# Patient Record
Sex: Male | Born: 1945 | Race: White | Hispanic: No | Marital: Married | Smoking: Never smoker
Health system: Southern US, Community
[De-identification: ages and names within clinical notes are randomized; demographics above are authoritative.]

## PROBLEM LIST (undated history)

## (undated) DIAGNOSIS — E119 Type 2 diabetes mellitus without complications: Secondary | ICD-10-CM

## (undated) DIAGNOSIS — N289 Disorder of kidney and ureter, unspecified: Secondary | ICD-10-CM

## (undated) HISTORY — PX: OTHER SURGICAL HISTORY: SHX169

---

## 2017-11-28 ENCOUNTER — Emergency Department: Payer: BLUE CROSS/BLUE SHIELD

## 2017-11-28 ENCOUNTER — Encounter: Payer: Self-pay | Admitting: Emergency Medicine

## 2017-11-28 ENCOUNTER — Inpatient Hospital Stay
Admission: EM | Admit: 2017-11-28 | Discharge: 2017-12-03 | DRG: 682 | Disposition: A | Discharge status: Home / Self Care | Payer: BLUE CROSS/BLUE SHIELD | Attending: Internal Medicine | Admitting: Internal Medicine

## 2017-11-28 ENCOUNTER — Other Ambulatory Visit: Payer: Self-pay

## 2017-11-28 DIAGNOSIS — N179 Acute kidney failure, unspecified: Secondary | ICD-10-CM | POA: Diagnosis present

## 2017-11-28 DIAGNOSIS — K439 Ventral hernia without obstruction or gangrene: Secondary | ICD-10-CM | POA: Diagnosis present

## 2017-11-28 DIAGNOSIS — E876 Hypokalemia: Secondary | ICD-10-CM | POA: Diagnosis present

## 2017-11-28 DIAGNOSIS — G9341 Metabolic encephalopathy: Secondary | ICD-10-CM | POA: Diagnosis present

## 2017-11-28 DIAGNOSIS — N1330 Unspecified hydronephrosis: Secondary | ICD-10-CM

## 2017-11-28 DIAGNOSIS — I951 Orthostatic hypotension: Secondary | ICD-10-CM | POA: Diagnosis present

## 2017-11-28 DIAGNOSIS — I351 Nonrheumatic aortic (valve) insufficiency: Secondary | ICD-10-CM | POA: Diagnosis not present

## 2017-11-28 DIAGNOSIS — Z7951 Long term (current) use of inhaled steroids: Secondary | ICD-10-CM

## 2017-11-28 DIAGNOSIS — D631 Anemia in chronic kidney disease: Secondary | ICD-10-CM | POA: Diagnosis present

## 2017-11-28 DIAGNOSIS — E872 Acidosis: Secondary | ICD-10-CM | POA: Diagnosis present

## 2017-11-28 DIAGNOSIS — N185 Chronic kidney disease, stage 5: Secondary | ICD-10-CM | POA: Diagnosis present

## 2017-11-28 DIAGNOSIS — N132 Hydronephrosis with renal and ureteral calculous obstruction: Secondary | ICD-10-CM | POA: Diagnosis present

## 2017-11-28 DIAGNOSIS — Z8619 Personal history of other infectious and parasitic diseases: Secondary | ICD-10-CM

## 2017-11-28 DIAGNOSIS — R55 Syncope and collapse: Secondary | ICD-10-CM | POA: Diagnosis present

## 2017-11-28 DIAGNOSIS — F0390 Unspecified dementia without behavioral disturbance: Secondary | ICD-10-CM | POA: Diagnosis present

## 2017-11-28 DIAGNOSIS — I120 Hypertensive chronic kidney disease with stage 5 chronic kidney disease or end stage renal disease: Secondary | ICD-10-CM | POA: Diagnosis present

## 2017-11-28 DIAGNOSIS — Z936 Other artificial openings of urinary tract status: Secondary | ICD-10-CM | POA: Diagnosis not present

## 2017-11-28 DIAGNOSIS — E860 Dehydration: Secondary | ICD-10-CM | POA: Diagnosis present

## 2017-11-28 DIAGNOSIS — E1122 Type 2 diabetes mellitus with diabetic chronic kidney disease: Secondary | ICD-10-CM | POA: Diagnosis present

## 2017-11-28 DIAGNOSIS — Z87442 Personal history of urinary calculi: Secondary | ICD-10-CM | POA: Diagnosis not present

## 2017-11-28 DIAGNOSIS — N139 Obstructive and reflux uropathy, unspecified: Secondary | ICD-10-CM

## 2017-11-28 DIAGNOSIS — N2581 Secondary hyperparathyroidism of renal origin: Secondary | ICD-10-CM | POA: Diagnosis present

## 2017-11-28 DIAGNOSIS — R7989 Other specified abnormal findings of blood chemistry: Secondary | ICD-10-CM | POA: Diagnosis present

## 2017-11-28 DIAGNOSIS — F03A0 Unspecified dementia, mild, without behavioral disturbance, psychotic disturbance, mood disturbance, and anxiety: Secondary | ICD-10-CM

## 2017-11-28 HISTORY — DX: Disorder of kidney and ureter, unspecified: N28.9

## 2017-11-28 HISTORY — DX: Type 2 diabetes mellitus without complications: E11.9

## 2017-11-28 LAB — MAGNESIUM: MAGNESIUM: 2.4 mg/dL (ref 1.7–2.4)

## 2017-11-28 LAB — COMPREHENSIVE METABOLIC PANEL
ALBUMIN: 3.4 g/dL — AB (ref 3.5–5.0)
ALT: 20 U/L (ref 0–44)
ANION GAP: 20 — AB (ref 5–15)
AST: 16 U/L (ref 15–41)
Alkaline Phosphatase: 62 U/L (ref 38–126)
BUN: 154 mg/dL — AB (ref 8–23)
CHLORIDE: 96 mmol/L — AB (ref 98–111)
CO2: 17 mmol/L — ABNORMAL LOW (ref 22–32)
Calcium: 7.3 mg/dL — ABNORMAL LOW (ref 8.9–10.3)
Creatinine, Ser: 10.44 mg/dL — ABNORMAL HIGH (ref 0.61–1.24)
GFR calc Af Amer: 5 mL/min — ABNORMAL LOW (ref >60)
GFR, EST NON AFRICAN AMERICAN: 4 mL/min — AB (ref >60)
GLUCOSE: 153 mg/dL — AB (ref 70–99)
POTASSIUM: 4 mmol/L (ref 3.5–5.1)
Sodium: 133 mmol/L — ABNORMAL LOW (ref 135–145)
Total Bilirubin: 0.4 mg/dL (ref 0.3–1.2)
Total Protein: 7.2 g/dL (ref 6.5–8.1)

## 2017-11-28 LAB — CBC WITH DIFFERENTIAL/PLATELET
BASOS ABS: 0 10*3/uL (ref 0–0.1)
Basophils Relative: 0 %
Eosinophils Absolute: 0 10*3/uL (ref 0–0.7)
Eosinophils Relative: 0 %
HCT: 23.2 % — ABNORMAL LOW (ref 40.0–52.0)
Hemoglobin: 7.6 g/dL — ABNORMAL LOW (ref 13.0–18.0)
LYMPHS PCT: 11 %
Lymphs Abs: 1.4 10*3/uL (ref 1.0–3.6)
MCH: 26.2 pg (ref 26.0–34.0)
MCHC: 33 g/dL (ref 32.0–36.0)
MCV: 79.4 fL — ABNORMAL LOW (ref 80.0–100.0)
MONO ABS: 0.6 10*3/uL (ref 0.2–1.0)
Monocytes Relative: 5 %
NEUTROS PCT: 84 %
Neutro Abs: 10.5 10*3/uL — ABNORMAL HIGH (ref 1.4–6.5)
PLATELETS: 357 10*3/uL (ref 150–440)
RBC: 2.92 MIL/uL — AB (ref 4.40–5.90)
RDW: 17.9 % — AB (ref 11.5–14.5)
WBC: 12.6 10*3/uL — ABNORMAL HIGH (ref 3.8–10.6)

## 2017-11-28 LAB — ETHANOL

## 2017-11-28 LAB — BRAIN NATRIURETIC PEPTIDE: B Natriuretic Peptide: 63 pg/mL (ref 0.0–100.0)

## 2017-11-28 LAB — TROPONIN I
TROPONIN I: 0.05 ng/mL — AB (ref <0.03)
Troponin I: 0.05 ng/mL (ref <0.03)

## 2017-11-28 MED ORDER — SODIUM CHLORIDE 0.9 % IV SOLN
INTRAVENOUS | Status: DC
  Administered 2017-11-28 – 2017-12-03 (×7): via INTRAVENOUS

## 2017-11-28 MED ORDER — SENNOSIDES-DOCUSATE SODIUM 8.6-50 MG PO TABS: 1.0000 | ORAL_TABLET | Freq: Every evening | ORAL | Status: DC | PRN

## 2017-11-28 MED ORDER — ACETAMINOPHEN 650 MG RE SUPP: 650.0000 mg | Freq: Four times a day (QID) | RECTAL | Status: DC | PRN

## 2017-11-28 MED ORDER — ONDANSETRON HCL 4 MG/2ML IJ SOLN
4.0000 mg | Freq: Four times a day (QID) | INTRAMUSCULAR | Status: DC | PRN
  Filled 2017-11-28: qty 2

## 2017-11-28 MED ORDER — ACETAMINOPHEN 325 MG PO TABS: 650.0000 mg | ORAL_TABLET | Freq: Four times a day (QID) | ORAL | Status: DC | PRN

## 2017-11-28 MED ORDER — ONDANSETRON HCL 4 MG PO TABS: 4.0000 mg | ORAL_TABLET | Freq: Four times a day (QID) | ORAL | Status: DC | PRN

## 2017-11-28 MED ORDER — SODIUM CHLORIDE 0.9 % IV BOLUS
500.0000 mL | Freq: Once | INTRAVENOUS | Status: AC
  Administered 2017-11-28: 500 mL via INTRAVENOUS

## 2017-11-28 MED ORDER — HEPARIN SODIUM (PORCINE) 5000 UNIT/ML IJ SOLN
5000.0000 [IU] | Freq: Three times a day (TID) | INTRAMUSCULAR | Status: DC
  Administered 2017-11-28 – 2017-11-30 (×4): 5000 [IU] via SUBCUTANEOUS
  Filled 2017-11-28 (×5): qty 1

## 2017-11-28 NOTE — Progress Notes (Signed)
Advanced care plan. Purpose of the Encounter: CODE STATUS Parties in Attendance: Patient Patient's Decision Capacity: Good Subjective/Patient's story: Presented to the emergency room for episodes of syncope Objective/Medical story Patient had couple of episodes of syncope in the last few weeks he also was in the gas station And was dehydrated and felt hot and passed out briefly but completely awake in the emergency room and verbally responsive. Goals of care determination:  Advance care directives and goals of care discussed with the patient in detail He will be evaluated for his kidney disease and work-up for syncope For now he wants everything done which includes cardiac resuscitation, intubation and ventilator if the need arises CODE STATUS: Full code Time spent discussing advanced care planning: 16 minutes

## 2017-11-28 NOTE — ED Notes (Signed)
Per pt he was driving from [place] to the jail to pick up a male inmate. When asked why he said he wasn't sure how to explain it. Also states he was driving down here to make an appt with [facility] for his kidney disease. We are unable to reach his wife - left a msg at the number given. His cell phone is dead and wont hold a charge when we tried to charge it. Has a [employer] hotel card in his shirt pocket. States he stayed there last night. Story is a little confusing. Officers to verify if pt is a code silver from [place].

## 2017-11-28 NOTE — ED Triage Notes (Signed)
Pt was driving from [place]. Stopped at gas station - ems was called for a fall. Pt was sitting outside (per pt for 10 mins) pt reported dizziness. When pt stood for ems became pale and diaphoretic and per ems was unconscious but was still able to sit back down once he was sitting again he was a & ox4

## 2017-11-28 NOTE — H&P (Addendum)
[facility] [place] [facility]   PATIENT NAME: Manuel Walker    MR#:  [id number]  DATE OF BIRTH:  1945-09-26  DATE OF ADMISSION:  11/28/2017  PRIMARY CARE PHYSICIAN: System, Pcp Not In   REQUESTING/REFERRING PHYSICIAN:   CHIEF COMPLAINT:   Chief Complaint  Patient presents with  . Loss of Consciousness    HISTORY OF PRESENT ILLNESS: Manuel Walker  is a 72 y.o. male with a known history of chronic kidney disease stage V, diabetes mellitus type 2 was driving down from [place] and he was wandering around in his car and came to [facility].  He was at the [employer] station patient felt very hot and passed out briefly.  Patient had nothing to drink since this morning after he left the home at 5 AM.  He has been driving in the hot sun.  He just had a bowel of cereal today since he left the home.  EMS was called when patient was found on the ground in the gas station no evidence of any trauma to the head patient is completely awake and verbally responsive in the emergency room.. No complaints of any chest pain, shortness of breath.  Patient has a nephrostomy tube and ventral abdominal hernia .  He was worked up with CT head in the emergency room.  Hospitalist service was consulted for further care.  Emergency room physician had an opportunity talk to patient's wife in [place] who said patient had multiple episodes of syncope in the last couple of weeks.  PAST MEDICAL HISTORY:   Past Medical History:  Diagnosis Date  . Diabetes mellitus without complication (HCC)   . Renal disorder     PAST SURGICAL HISTORY:  Past Surgical History:  Procedure Laterality Date  . nephostomy      SOCIAL HISTORY:  Social History   Tobacco Use  . Smoking status: Never Smoker  . Smokeless tobacco: Never Used  Substance Use Topics  . Alcohol use: Not Currently    FAMILY HISTORY: History reviewed. No pertinent family history.  DRUG ALLERGIES:   Allergies  Allergen Reactions  . Penicillins     REVIEW OF SYSTEMS:   CONSTITUTIONAL: No fever, has fatigue and weakness.  EYES: No blurred or double vision.  EARS, NOSE, AND THROAT: No tinnitus or ear pain.  RESPIRATORY: No cough, shortness of breath, wheezing or hemoptysis.  CARDIOVASCULAR: No chest pain, orthopnea, edema.  GASTROINTESTINAL: No nausea, vomiting, diarrhea or abdominal pain.  GENITOURINARY: No dysuria, hematuria.  ENDOCRINE: No polyuria, nocturia,  HEMATOLOGY: No anemia, easy bruising or bleeding SKIN: No rash or lesion. MUSCULOSKELETAL: No joint pain or arthritis.   NEUROLOGIC: No tingling, numbness, weakness.  PSYCHIATRY: No anxiety or depression.   MEDICATIONS AT HOME:  Prior to Admission medications   Not on File      PHYSICAL EXAMINATION:   VITAL SIGNS: Blood pressure 110/70, pulse 88, temperature 98.5 F (36.9 C), resp. rate 12, height 6' 0.5" (1.842 m), weight 73.5 kg (162 lb), SpO2 98 %.  GENERAL:  72 y.o.-year-old patient lying in the bed with no acute distress.  EYES: Pupils equal, round, reactive to light and accommodation. No scleral icterus. Extraocular muscles intact.  HEENT: Head atraumatic, normocephalic. Oropharynx dry and nasopharynx clear.  NECK:  Supple, no jugular venous distention. No thyroid enlargement, no tenderness.  LUNGS: Normal breath sounds bilaterally, no wheezing, rales,rhonchi or crepitation. No use of accessory muscles of respiration.  CARDIOVASCULAR: S1, S2 normal. No  murmurs, rubs, or gallops.  ABDOMEN: Soft, nontender, nondistended. Bowel sounds present.  Has ventral hernia EXTREMITIES: No pedal edema, cyanosis, or clubbing.  NEUROLOGIC: Cranial nerves II through XII are intact. Muscle strength 5/5 in all extremities. Sensation intact. Gait not checked.  PSYCHIATRIC: The patient is alert and oriented x 3.  SKIN: No obvious rash, lesion, or ulcer.   LABORATORY PANEL:   CBC Recent Labs  Lab 11/28/17 1707  WBC  12.6*  HGB 7.6*  HCT 23.2*  PLT 357  MCV 79.4*  MCH 26.2  MCHC 33.0  RDW 17.9*  LYMPHSABS 1.4  MONOABS 0.6  EOSABS 0.0  BASOSABS 0.0   ------------------------------------------------------------------------------------------------------------------  Chemistries  Recent Labs  Lab 11/28/17 1707  NA 133*  K 4.0  CL 96*  CO2 17*  GLUCOSE 153*  BUN 154*  CREATININE 10.44*  CALCIUM 7.3*  MG 2.4  AST 16  ALT 20  ALKPHOS 62  BILITOT 0.4   ------------------------------------------------------------------------------------------------------------------ estimated creatinine clearance is 6.6 mL/min (A) (by C-G formula based on SCr of 10.44 mg/dL (H)). ------------------------------------------------------------------------------------------------------------------ No results for input(s): TSH, T4TOTAL, T3FREE, THYROIDAB in the last 72 hours.  Invalid input(s): FREET3   Coagulation profile No results for input(s): INR, PROTIME in the last 168 hours. ------------------------------------------------------------------------------------------------------------------- No results for input(s): DDIMER in the last 72 hours. -------------------------------------------------------------------------------------------------------------------  Cardiac Enzymes Recent Labs  Lab 11/28/17 1707  TROPONINI 0.05*   ------------------------------------------------------------------------------------------------------------------ Invalid input(s): POCBNP  ---------------------------------------------------------------------------------------------------------------  Urinalysis No results found for: COLORURINE, APPEARANCEUR, LABSPEC, PHURINE, GLUCOSEU, HGBUR, BILIRUBINUR, KETONESUR, PROTEINUR, UROBILINOGEN, NITRITE, LEUKOCYTESUR   RADIOLOGY: Ct Head Wo Contrast  Result Date: 11/28/2017 CLINICAL DATA:  Syncope. EXAM: CT HEAD WITHOUT CONTRAST TECHNIQUE: Contiguous axial images were  obtained from the base of the skull through the vertex without intravenous contrast. COMPARISON:  None. FINDINGS: Brain: No evidence of acute infarction, hemorrhage, hydrocephalus, extra-axial collection or mass lesion/mass effect. Mild generalized cerebral atrophy. Scattered mild periventricular and subcortical white matter hypodensities are nonspecific, but favored to reflect chronic microvascular ischemic changes. 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 Calcified atherosclerosis at the skullbase. No hyperdense vessel. Skull: Normal. Negative for fracture or focal lesion. Sinuses/Orbits: No acute finding. Other: Left frontal scalp lipoma. IMPRESSION: 1.  No acute intracranial abnormality. 2. 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 Non-emergent CTA or MRA of the head is recommended for further evaluation. Electronically Signed   By: Obie Dredge M.D.   On: 11/28/2017 18:24    EKG: Orders placed or performed during the hospital encounter of 11/28/17  . ED EKG  . ED EKG  . EKG 12-Lead  . EKG 12-Lead    IMPRESSION AND PLAN:  72 year old male patient with history of chronic kidney disease stage V with a nephrostomy tube not on dialysis, diabetes mellitus type 2 was found wandering in the car in [place].  He also had passed out in the gas station before being picked up by EMS.  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 Management plans discussed with the patient, family and they are in agreement.  CODE STATUS:Full  code    TOTAL TIME TAKING CARE OF THIS  PATIENT: 55 minutes.    Ihor AustinPavan Arihana Ambrocio M.D on 11/28/2017 at 6:27 PM  Between 7am to 6pm - Pager - [phone]  After 6pm go to [email or web address] - password [email or web address]  [facility]  Office  [phone]  CC: Primary care physician; System, Pcp Not In

## 2017-11-28 NOTE — ED Notes (Signed)
Pt has nephrostomy in left kidney. States also urinates. Pt given urinal. Has nephrostomy d/t kidney stone

## 2017-11-28 NOTE — Progress Notes (Signed)
Pt arrived from ED alert and oriented. No c/o pain, no SOB. Pt oriented to equipment and staff. No concerns offered at this time.

## 2017-11-28 NOTE — ED Provider Notes (Addendum)
[facility] Emergency Department Provider Note  ____________________________________________   I have reviewed the triage vital signs and the nursing notes. Where available I have reviewed prior notes and, if possible and indicated, outside hospital notes.    HISTORY  Chief Complaint Loss of Consciousness    HPI Manuel Walker is a [age] y.o. male \\who  is a somewhat poor historian.  Patient resents today after 2 syncopal events.  Patient is driving down from somewhere in [place] to pick someone up he states.  He was at a gas station, he felt hot and he passed out briefly.  EMS was called they found him on the ground, no evidence of trauma, he was able to walk but as he was walking he passed out again they caught him.  Patient denies head injury.  He states that he does not know why he passed out.  Did not have any chest pain or shortness of breath.  He states he had nothing to drink but a little bit of water today and it is very hot out.  He is in an air conditioned car.  He states the only thing he had to eat today was a bowl of cereal which is atypical for him.  He states he was focused on driving.  He denies any chest pain shortness of breath nausea or vomiting.  When asking if he has any health issues he states that he does not.  He does take metoprolol.  He has a nephrostomy tube draining on the left side which she states is from kidney stones.  Level 5 chart caveat; no further history available due to patient status.      Past Medical History:  Diagnosis Date  . Diabetes mellitus without complication (HCC)   . Renal disorder     There are no active problems to display for this patient.   Past Surgical History:  Procedure Laterality Date  . nephostomy      Prior to Admission medications   Not on File    Allergies Penicillins  History reviewed. No pertinent family history.  Social History Social History   Tobacco Use  . Smoking status: Never  Smoker  . Smokeless tobacco: Never Used  Substance Use Topics  . Alcohol use: Not Currently  . Drug use: Not Currently    Review of Systems Constitutional: No fever/chills Eyes: No visual changes. ENT: No sore throat. No stiff neck no neck pain Cardiovascular: Denies chest pain. Respiratory: Denies shortness of breath. Gastrointestinal:   no vomiting.  No diarrhea.  No constipation. Genitourinary: Negative for dysuria. Musculoskeletal: Negative lower extremity swelling Skin: Negative for rash. Neurological: Negative for severe headaches, focal weakness or numbness.   ____________________________________________   PHYSICAL EXAM:  VITAL SIGNS: ED Triage Vitals  Enc Vitals Group     BP 11/28/17 1658 110/70     Pulse Rate 11/28/17 1658 88     Resp 11/28/17 1658 12     Temp 11/28/17 1658 98.5 F (36.9 C)     Temp src --      SpO2 11/28/17 1658 98 %     Weight 11/28/17 1659 162 lb (73.5 kg)     Height 11/28/17 1659 6' 0.5" (1.842 m)     Head Circumference --      Peak Flow --      Pain Score 11/28/17 1659 0     Pain Loc --      Pain Edu? --      Excl. in  GC? --     Constitutional: Alert and oriented to name and place, unsure of the date.. Well appearing and in no acute distress. Eyes: Conjunctivae are normal Head: Atraumatic HEENT: No congestion/rhinnorhea. Mucous membranes are moist.  Oropharynx non-erythematous Neck:   Nontender with no meningismus, no masses, no stridor Cardiovascular: Normal rate, regular rhythm. Grossly normal heart sounds.  Good peripheral circulation. Respiratory: Normal respiratory effort.  No retractions. Lungs CTAB. Abdominal: Soft and nontender. No distention. No guarding no rebound Back:  There is no focal tenderness or step off.  there is no midline tenderness there are no lesions noted. there is no CVA tenderness, urostomy tube in place Musculoskeletal: No lower extremity tenderness, no upper extremity tenderness. No joint effusions, no  DVT signs strong distal pulses no edema Neurologic:  Normal speech and language. No gross focal neurologic deficits are appreciated.  Skin:  Skin is warm, dry and intact. No rash noted. Psychiatric: Mood and affect are normal. Speech and behavior are normal.  ____________________________________________   LABS (all labs ordered are listed, but only abnormal results are displayed)  Labs Reviewed  CBC WITH DIFFERENTIAL/PLATELET - Abnormal; Notable for the following components:      Result Value   WBC 12.6 (*)    RBC 2.92 (*)    Hemoglobin 7.6 (*)    HCT 23.2 (*)    MCV 79.4 (*)    RDW 17.9 (*)    Neutro Abs 10.5 (*)    All other components within normal limits  MAGNESIUM  COMPREHENSIVE METABOLIC PANEL  ETHANOL  BRAIN NATRIURETIC PEPTIDE  TROPONIN I  URINALYSIS, COMPLETE (UACMP) WITH MICROSCOPIC    Pertinent labs  results that were available during my care of the patient were reviewed by me and considered in my medical decision making (see chart for details). ____________________________________________  EKG  I personally interpreted any EKGs ordered by me or triage Sinus rhythm rate 87 bpm, normal axis, PACs noted.  No acute ischemic changes.  Nonspecific ST changes right bundle branch block no old for comparison ____________________________________________  RADIOLOGY  Pertinent labs & imaging results that were available during my care of the patient were reviewed by me and considered in my medical decision making (see chart for details). If possible, patient and/or family made aware of any abnormal findings.  No results found. ____________________________________________    PROCEDURES  Procedure(s) performed: None  Procedures  Critical Care performed: CRITICAL CARE Performed by: Jeanmarie Plant   Total critical care time: 55 minutes  Critical care time was exclusive of separately billable procedures and treating other patients.  Critical care was necessary  to treat or prevent imminent or life-threatening deterioration.  Critical care was time spent personally by me on the following activities: development of treatment plan with patient and/or surrogate as well as nursing, discussions with consultants, evaluation of patient's response to treatment, examination of patient, obtaining history from patient or surrogate, ordering and performing treatments and interventions, ordering and review of laboratory studies, ordering and review of radiographic studies, pulse oximetry and re-evaluation of patient's condition.   ____________________________________________   INITIAL IMPRESSION / ASSESSMENT AND PLAN / ED COURSE  Pertinent labs & imaging results that were available during my care of the patient were reviewed by me and considered in my medical decision making (see chart for details).  Patient here after 2 syncopal events today.  Is quite hot with a very high he has exceeded not much to eat or drink and that could be why I  am concerned because he has no way to get back to [place] without driving and he did pass out twice once was witnessed by EMS with no seizure activity.  Blood sugar was normal for EMS prior to coming in.  Patient's work-up is reassuring although he is unsure of the date and we have no way of knowing if this is his baseline.  I did try to call his wife and left a message which is Cipro compliant but have not yet heard back.  Given his confusion, which I do not know if this is baseline or not at age 32, we will obtain CT scan of the head, and I am initiating broad work-up to make sure there is no other acute pathology.  Given 2 episodes of syncope which certainly could be cardiac mediated, we will also admit to the hospital for observation.  ----------------------------------------- 6:00 PM on 11/28/2017 -----------------------------------------  I was able finally to talk to his wife, she states that he is passed out numerous times  she is not sure why is driving in [place] today, patient said something vague about picking someone up.  She states that he is confused at baseline these days.  She states he is in stage V kidney failure, and that his creatinine has been as high as 11 although usually is around 8, however he is been refusing dialysis.  He is known to be significantly anemic at baseline although she does not know those numbers.  She gets his care from [facility].  Patient was in the ER couple weeks ago apparently after nearly passing out or passing out and was told by the ER doctor that he would die without dialysis and this is something he is apparently aware of.  None of this he did tell me.  In any event, patient seems to be close to his baseline but his baseline is quite ill, certainly do not feel it safe for him at this point to be driving down the road.  His wife's phone number is [phone], and I have advised her to stay by the phone for the hospital doctors phone call.  Patient apparently is very significantly ill at baseline and has refused some care.  He has not had his blood drawn in 2 months so we do not know exactly where he stands in terms of his blood work according to his wife is unclear if that was done in the emergency room we will try to see if we can find more records now that we know what hospital he goes to  ----------------------------------------- 6:33 PM on 11/28/2017 -----------------------------------------  BUN/creatinine are pretty unremarkable today.  Baseline seems to be around 8 according to his wife although last time it was checked it was in the 6 range, today is 10 he is been this high before BUNs 154.  Hospitalist is taken over care.    ____________________________________________   FINAL CLINICAL IMPRESSION(S) / ED DIAGNOSES  Final diagnoses:  None      This chart was dictated using voice recognition software.  Despite best efforts to proofread,  errors can occur  which can change meaning.      Jeanmarie Plant, MD 11/28/17 1752    Jeanmarie Plant, MD 11/28/17 1805    Jeanmarie Plant, MD 11/28/17 [phone]

## 2017-11-28 NOTE — ED Notes (Signed)
Report called  

## 2017-11-29 ENCOUNTER — Inpatient Hospital Stay (HOSPITAL_COMMUNITY)
Admit: 2017-11-29 | Discharge: 2017-11-29 | Disposition: A | Discharge status: Home / Self Care | Payer: BLUE CROSS/BLUE SHIELD | Attending: Internal Medicine | Admitting: Internal Medicine

## 2017-11-29 ENCOUNTER — Inpatient Hospital Stay: Payer: BLUE CROSS/BLUE SHIELD

## 2017-11-29 ENCOUNTER — Other Ambulatory Visit: Payer: Self-pay

## 2017-11-29 DIAGNOSIS — R55 Syncope and collapse: Secondary | ICD-10-CM

## 2017-11-29 DIAGNOSIS — Z936 Other artificial openings of urinary tract status: Secondary | ICD-10-CM

## 2017-11-29 DIAGNOSIS — I351 Nonrheumatic aortic (valve) insufficiency: Secondary | ICD-10-CM

## 2017-11-29 DIAGNOSIS — N179 Acute kidney failure, unspecified: Secondary | ICD-10-CM

## 2017-11-29 LAB — BASIC METABOLIC PANEL
Anion gap: 16 — ABNORMAL HIGH (ref 5–15)
BUN: 158 mg/dL — ABNORMAL HIGH (ref 8–23)
CO2: 19 mmol/L — AB (ref 22–32)
Calcium: 6.7 mg/dL — ABNORMAL LOW (ref 8.9–10.3)
Chloride: 101 mmol/L (ref 98–111)
Creatinine, Ser: 10.33 mg/dL — ABNORMAL HIGH (ref 0.61–1.24)
GFR calc non Af Amer: 4 mL/min — ABNORMAL LOW (ref >60)
GFR, EST AFRICAN AMERICAN: 5 mL/min — AB (ref >60)
Glucose, Bld: 105 mg/dL — ABNORMAL HIGH (ref 70–99)
Potassium: 3.7 mmol/L (ref 3.5–5.1)
Sodium: 136 mmol/L (ref 135–145)

## 2017-11-29 LAB — GLUCOSE, CAPILLARY
Glucose-Capillary: 99 mg/dL (ref 70–99)
Glucose-Capillary: 108 mg/dL — ABNORMAL HIGH (ref 70–99)

## 2017-11-29 LAB — ECHOCARDIOGRAM COMPLETE
HEIGHTINCHES: 72 in
Weight: 3051.2 oz

## 2017-11-29 LAB — TROPONIN I: Troponin I: 0.05 ng/mL (ref <0.03)

## 2017-11-29 LAB — PHOSPHORUS: Phosphorus: 9.9 mg/dL — ABNORMAL HIGH (ref 2.5–4.6)

## 2017-11-29 MED ORDER — INSULIN ASPART 100 UNIT/ML ~~LOC~~ SOLN: 0.0000 [IU] | Freq: Every day | SUBCUTANEOUS | Status: DC

## 2017-11-29 MED ORDER — CALCIUM ACETATE (PHOS BINDER) 667 MG PO CAPS
667.0000 mg | ORAL_CAPSULE | Freq: Three times a day (TID) | ORAL | Status: DC
  Administered 2017-11-29 – 2017-12-01 (×3): 667 mg via ORAL
  Filled 2017-11-29 (×8): qty 1

## 2017-11-29 MED ORDER — FLUTICASONE PROPIONATE 50 MCG/ACT NA SUSP
1.0000 | Freq: Every day | NASAL | Status: DC
  Administered 2017-11-29 – 2017-12-03 (×5): 1 via NASAL
  Filled 2017-11-29: qty 16

## 2017-11-29 MED ORDER — SODIUM BICARBONATE 650 MG PO TABS
650.0000 mg | ORAL_TABLET | Freq: Four times a day (QID) | ORAL | Status: DC
  Administered 2017-11-29 – 2017-12-03 (×15): 650 mg via ORAL
  Filled 2017-11-29 (×15): qty 1

## 2017-11-29 MED ORDER — INSULIN ASPART 100 UNIT/ML ~~LOC~~ SOLN
0.0000 [IU] | Freq: Three times a day (TID) | SUBCUTANEOUS | Status: DC
  Administered 2017-11-30: 1 [IU] via SUBCUTANEOUS
  Filled 2017-11-29: qty 1

## 2017-11-29 MED ORDER — VITAMIN D3 25 MCG (1000 UNIT) PO TABS
1000.0000 [IU] | ORAL_TABLET | Freq: Every day | ORAL | Status: DC
  Administered 2017-11-29 – 2017-12-03 (×4): 1000 [IU] via ORAL
  Filled 2017-11-29 (×5): qty 1

## 2017-11-29 NOTE — Progress Notes (Signed)
Patient was sitting in the chair.  I found his nephrostomy tube and bag on the floor.When he looked at it he didntt know what it was.  When I asked if he knew he had a nephrostomy tube it said he did.  I asked how it came out and he didn't know.  Dr. Imogene Burnhen and Dr. Wynelle LinkKolluru notified.

## 2017-11-29 NOTE — Progress Notes (Addendum)
[employer] -  at [facility]   PATIENT NAME: Manuel SprayRobert Walker    MR#:  [id number]  DATE OF BIRTH:  05/31/1946  SUBJECTIVE:  CHIEF COMPLAINT:   Chief Complaint  Patient presents with  . Loss of Consciousness   The patient has no complaints. REVIEW OF SYSTEMS:  Review of Systems  Constitutional: Negative for chills, fever and malaise/fatigue.  HENT: Negative for sore throat.   Eyes: Negative for blurred vision and double vision.  Respiratory: Negative for cough, hemoptysis, shortness of breath, wheezing and stridor.   Cardiovascular: Negative for chest pain, palpitations, orthopnea and leg swelling.  Gastrointestinal: Negative for abdominal pain, blood in stool, diarrhea, melena, nausea and vomiting.  Genitourinary: Negative for dysuria, flank pain and hematuria.  Musculoskeletal: Negative for back pain and joint pain.  Skin: Negative for rash.  Neurological: Negative for dizziness, sensory change, focal weakness, seizures, loss of consciousness, weakness and headaches.  Endo/Heme/Allergies: Negative for polydipsia.  Psychiatric/Behavioral: Negative for depression. The patient is not nervous/anxious.     DRUG ALLERGIES:   Allergies  Allergen Reactions  . Penicillins Swelling    Has patient had a PCN reaction causing immediate rash, facial/tongue/throat swelling, SOB or lightheadedness with hypotension: Unknown Has patient had a PCN reaction causing severe rash involving mucus membranes or skin necrosis: Unknown Has patient had a PCN reaction that required hospitalization: Unknown Has patient had a PCN reaction occurring within the last 10 years: Unknown If all of the above answers are "NO", then may proceed with Cephalosporin use.   VITALS:  Blood pressure 117/74, pulse 89, temperature 98.1 F (36.7 C), temperature source Oral, resp. rate 16, height 6' (1.829 m), weight 190 lb 11.2 oz (86.5 kg), SpO2 100 %. PHYSICAL EXAMINATION:  Physical Exam    Constitutional: He is oriented to person, place, and time. He appears well-developed.  HENT:  Head: Normocephalic.  Mouth/Throat: Oropharynx is clear and moist.  Eyes: Pupils are equal, round, and reactive to light. Conjunctivae and EOM are normal. No scleral icterus.  Neck: Normal range of motion. Neck supple. No JVD present. No tracheal deviation present.  Cardiovascular: Normal rate, regular rhythm and normal heart sounds. Exam reveals no gallop.  No murmur heard. Pulmonary/Chest: Effort normal and breath sounds normal. No respiratory distress. He has no wheezes. He has no rales.  Abdominal: Soft. Bowel sounds are normal. He exhibits no distension. There is no tenderness. There is no rebound.  Musculoskeletal: Normal range of motion. He exhibits no edema or tenderness.  Neurological: He is alert and oriented to person, place, and time. No cranial nerve deficit.  Skin: No rash noted. No erythema.  Psychiatric: He has a normal mood and affect.   LABORATORY PANEL:  Male CBC Recent Labs  Lab 11/28/17 1707  WBC 12.6*  HGB 7.6*  HCT 23.2*  PLT 357   ------------------------------------------------------------------------------------------------------------------ Chemistries  Recent Labs  Lab 11/28/17 1707 11/29/17 0502  NA 133* 136  K 4.0 3.7  CL 96* 101  CO2 17* 19*  GLUCOSE 153* 105*  BUN 154* PENDING  CREATININE 10.44* 10.33*  CALCIUM 7.3* 6.7*  MG 2.4  --   AST 16  --   ALT 20  --   ALKPHOS 62  --   BILITOT 0.4  --    RADIOLOGY:  Ct Head Wo Contrast  Result Date: 11/28/2017 CLINICAL DATA:  Syncope. EXAM: CT HEAD WITHOUT CONTRAST TECHNIQUE: Contiguous axial images were obtained from the base of the skull through the vertex without intravenous  contrast. COMPARISON:  None. FINDINGS: Brain: No evidence of acute infarction, hemorrhage, hydrocephalus, extra-axial collection or mass lesion/mass effect. Mild generalized cerebral atrophy. Scattered mild periventricular and  subcortical white matter hypodensities are nonspecific, but favored to reflect chronic microvascular ischemic changes. 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 Calcified atherosclerosis at the skullbase. No hyperdense vessel. Skull: Normal. Negative for fracture or focal lesion. Sinuses/Orbits: No acute finding. Other: Left frontal scalp lipoma. IMPRESSION: 1.  No acute intracranial abnormality. 2. 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 Non-emergent CTA or MRA of the head is recommended for further evaluation. Electronically Signed   By: Obie Dredge M.D.   On: 11/28/2017 18:24   US Carotid Bilateral  Result Date: 11/29/2017 CLINICAL DATA:  Syncope EXAM: BILATERAL CAROTID DUPLEX ULTRASOUND TECHNIQUE: Wallace Cullens scale imaging, color Doppler and duplex ultrasound was performed of bilateral carotid and vertebral arteries in the neck. COMPARISON:  None. TECHNIQUE: Quantification of carotid stenosis is based on velocity parameters that correlate the residual internal carotid diameter with NASCET-based stenosis levels, using the diameter of the distal internal carotid lumen as the denominator for stenosis measurement. The following velocity measurements were obtained: PEAK SYSTOLIC/END DIASTOLIC RIGHT ICA:                     121/30cm/sec CCA:                     106/10cm/sec SYSTOLIC ICA/CCA RATIO:  1.1 ECA:                     101cm/sec LEFT ICA:                     91/32cm/sec CCA:                     86/13cm/sec SYSTOLIC ICA/CCA RATIO:  1.1 ECA:                     92cm/sec FINDINGS: RIGHT CAROTID ARTERY: No significant plaque or stenosis. Normal waveforms and color Doppler signal. RIGHT VERTEBRAL ARTERY:  Normal flow direction and waveform. LEFT CAROTID ARTERY: Mild tortuosity. No significant plaque or stenosis. Normal waveforms and color Doppler signal. LEFT VERTEBRAL ARTERY: Normal flow direction and waveform. IMPRESSION: 1. No significant  carotid plaque or stenosis. 2.  Antegrade bilateral vertebral arterial flow. Electronically Signed   By: Corlis Leak M.D.   On: 11/29/2017 12:04   Dg Chest Port 1 View  Result Date: 11/28/2017 CLINICAL DATA:  Fall. EXAM: PORTABLE CHEST 1 VIEW COMPARISON:  None. FINDINGS: Mild cardiomegaly. The hila and mediastinum are unremarkable. No pneumothorax. No nodules or masses. No focal infiltrates. No cause for pain identified. IMPRESSION: No active disease. Electronically Signed   By: Gerome Sam III M.D   On: 11/28/2017 18:30   ASSESSMENT AND PLAN:   72 year old male patient with history of chronic kidney disease stage V with a nephrostomy tube not on dialysis, diabetes mellitus type 2 was found wandering in the car in [place].  He also had passed out in the gas station before being picked up by EMS.  -Altered mental status Acute metabolic encephalopathy Improved.  -Syncope, possible due to worsening renal function and orthostatic hypotension. Echocardiogram is pending, carotid ultrasound: Unremarkable. IV fluids, hold Lopressor.  Hypertension.  Hold beta-blocker due to low side blood pressure.  Elevated troponin.  Possible due to worsening renal  function.  Stable.  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 Per Dr. Cassandria Anger, follows with [facility] in [place], [place]. No acute indication for dialysis at this time. Continue sodium bicarbonate.   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  -Abdominal hernia Supportive care  Discussed with Dr. Wynelle Link. All the records are reviewed and case discussed with Care Management/Social Worker. Management plans discussed with the patient, family and they are in agreement.  CODE STATUS: Full Code  TOTAL TIME TAKING CARE OF THIS PATIENT: 36 minutes.   More than 50% of the time was spent in counseling/coordination of care: YES  POSSIBLE  D/C IN 2 DAYS, DEPENDING ON CLINICAL CONDITION.   Shaune Pollack M.D on 11/29/2017 at 1:44 PM  Between 7am to 6pm - Pager - [phone]  After 6pm go to [email or web address] - Therapist, nutritional Hospitalists

## 2017-11-29 NOTE — Consult Note (Signed)
[facility]  CONSULT NOTE    Date: 11/29/2017                  Patient Name:  Manuel Walker  MRN: [id number]  DOB: 26-Jan-1946  Age / Sex: 72 y.o., male         PCP: System, Pcp Not In                 Service Requesting Consult: Dr. Tobi BastosPyreddy                 Reason for Consult: Chronic kidney disease stage V            History of Present Illness: Mr. Manuel SprayRobert Boettner is a 72 y.o. white male with chronic kidney disease stage V, obstructive uropathy status post left urostomy, hypertension, diabetes mellitus type II, history of Lyme disease who was admitted to [facility] on 11/28/2017 for Syncope and collapse [R55] Syncope [R55]  Patient states he follows with [facility] [place]. He denies any uremic symptoms including dysguesia, edema, shortness of breath, nausea, anorexia, fatigue or pruritis. He states he has been educated about dialysis modalities and has had vessel mapping. Currently without an access.   Patient states that he was overheated from the heat and that is what caused his syncopal episodes. Denies any recent syncopal episodes prior to this one.   Patient continues to make urine, has a left nephrostomy tube.    Medications: Outpatient medications: Medications Prior to Admission  Medication Sig Dispense Refill Last Dose  . B Complex Vitamins (VITAMIN B COMPLEX PO) Take 1 tablet by mouth daily.   11/28/2017 at Unknown time  . calcium acetate (PHOSLO) 667 MG capsule Take 1 capsule by mouth 3 (three) times daily.   11/28/2017 at Unknown time  . cholecalciferol (VITAMIN D) 1000 units tablet Take 1 tablet by mouth daily.   11/28/2017 at Unknown time  . fluticasone (FLONASE) 50 MCG/ACT nasal spray Place 1 spray into the nose daily.   prn at prn  . metoprolol succinate (TOPROL-XL) 50 MG 24 hr tablet Take 1 tablet by mouth daily.   11/28/2017 at Unknown time  . sodium bicarbonate 650 MG tablet Take 1 tablet by mouth 4 (four) times daily.    11/28/2017 at Unknown time  . nebivolol (BYSTOLIC) 5 MG tablet Take 1 tablet by mouth daily.   Not Taking at Unknown time    Current medications: Current Facility-Administered Medications  Medication Dose Route Frequency Provider Last Rate Last Dose  . 0.9 %  sodium chloride infusion   Intravenous Continuous Ihor AustinPyreddy, Pavan, MD 75 mL/hr at 11/29/17 0455    . acetaminophen (TYLENOL) tablet 650 mg  650 mg Oral Q6H PRN Ihor AustinPyreddy, Pavan, MD       Or  . acetaminophen (TYLENOL) suppository 650 mg  650 mg Rectal Q6H PRN Pyreddy, Vivien RotaPavan, MD      . calcium acetate (PHOSLO) capsule 667 mg  667 mg Oral TID Shaune Pollackhen, Qing, MD      . cholecalciferol (VITAMIN D) tablet 1,000 Units  1,000 Units Oral Daily Shaune Pollackhen, Qing, MD      . fluticasone [facility](FLONASE) 50 MCG/ACT nasal spray 1 spray  1 spray Each Nare Daily Shaune Pollackhen, Qing, MD      . heparin injection 5,000 Units  5,000 Units Subcutaneous Q8H Ihor AustinPyreddy, Pavan, MD   5,000 Units at 11/29/17 0455  . ondansetron (ZOFRAN) tablet 4 mg  4 mg Oral Q6H PRN Ihor AustinPyreddy, Pavan, MD  Or  . ondansetron (ZOFRAN) injection 4 mg  4 mg Intravenous Q6H PRN Pyreddy, Pavan, MD      . senna-docusate (Senokot-S) tablet 1 tablet  1 tablet Oral QHS PRN Pyreddy, Vivien Rota, MD      . sodium bicarbonate tablet 650 mg  650 mg Oral QID Shaune Pollack, MD          Allergies: Allergies  Allergen Reactions  . Penicillins Swelling    Has patient had a PCN reaction causing immediate rash, facial/tongue/throat swelling, SOB or lightheadedness with hypotension: Unknown Has patient had a PCN reaction causing severe rash involving mucus membranes or skin necrosis: Unknown Has patient had a PCN reaction that required hospitalization: Unknown Has patient had a PCN reaction occurring within the last 10 years: Unknown If all of the above answers are "NO", then may proceed with Cephalosporin use.      Past Medical History: Past Medical History:  Diagnosis Date  . Diabetes mellitus without complication (HCC)   .  Renal disorder      Past Surgical History: Past Surgical History:  Procedure Laterality Date  . nephostomy       Family History: History reviewed. No pertinent family history.   Social History: Social History   Socioeconomic History  . Marital status: Married    Spouse name: Not on file  . Number of children: Not on file  . Years of education: Not on file  . Highest education level: Not on file  Occupational History  . Not on file  Social Needs  . Financial resource strain: Not on file  . Food insecurity:    Worry: Not on file    Inability: Not on file  . Transportation needs:    Medical: Not on file    Non-medical: Not on file  Tobacco Use  . Smoking status: Never Smoker  . Smokeless tobacco: Never Used  Substance and Sexual Activity  . Alcohol use: Not Currently  . Drug use: Not Currently  . Sexual activity: Not on file  Lifestyle  . Physical activity:    Days per week: Not on file    Minutes per session: Not on file  . Stress: Not on file  Relationships  . Social connections:    Talks on phone: Not on file    Gets together: Not on file    Attends religious service: Not on file    Active member of club or organization: Not on file    Attends meetings of clubs or organizations: Not on file    Relationship status: Not on file  . Intimate partner violence:    Fear of current or ex partner: Not on file    Emotionally abused: Not on file    Physically abused: Not on file    Forced sexual activity: Not on file  Other Topics Concern  . Not on file  Social History Narrative  . Not on file     Review of Systems: Review of Systems  Constitutional: Negative.  Negative for chills, diaphoresis, fever, malaise/fatigue and weight loss.  HENT: Negative.  Negative for congestion, ear discharge, ear pain, hearing loss, nosebleeds, sinus pain, sore throat and tinnitus.   Eyes: Negative.  Negative for blurred vision, double vision, photophobia, pain, discharge and  redness.  Respiratory: Negative.  Negative for cough, hemoptysis, sputum production, shortness of breath, wheezing and stridor.   Cardiovascular: Negative.  Negative for chest pain, palpitations, orthopnea, claudication, leg swelling and PND.  Gastrointestinal: Negative.  Negative for abdominal  pain, blood in stool, constipation, diarrhea, heartburn, melena, nausea and vomiting.  Genitourinary: Negative.  Negative for dysuria, flank pain, frequency, hematuria and urgency.  Musculoskeletal: Negative.  Negative for back pain, falls, joint pain, myalgias and neck pain.  Skin: Negative.  Negative for itching and rash.  Neurological: Negative.  Negative for dizziness, tingling, tremors, sensory change, speech change, focal weakness, seizures, loss of consciousness, weakness and headaches.  Endo/Heme/Allergies: Negative.  Negative for environmental allergies and polydipsia. Does not bruise/bleed easily.  Psychiatric/Behavioral: Negative.  Negative for depression, hallucinations, memory loss, substance abuse and suicidal ideas. The patient is not nervous/anxious and does not have insomnia.     Vital Signs: Blood pressure 122/72, pulse 68, temperature 98.3 F (36.8 C), temperature source Oral, resp. rate 18, height 6' (1.829 m), weight 86.5 kg (190 lb 11.2 oz), SpO2 100 %.  Weight trends: Filed Weights   11/28/17 1659 11/28/17 2016  Weight: 73.5 kg (162 lb) 86.5 kg (190 lb 11.2 oz)    Physical Exam: General: NAD, laying in bed  Head: Normocephalic, atraumatic. Moist oral mucosal membranes  Eyes: Anicteric, PERRL  Neck: Supple, trachea midline  Lungs:  Clear to auscultation  Heart: Regular rate and rhythm  Abdomen:  Soft, nontender, left nephrostomy tube  Extremities: no peripheral edema.  Neurologic: Nonfocal, moving all four extremities  Skin: No lesions  Access: none     Lab results: Basic Metabolic Panel: Recent Labs  Lab 11/28/17 1707 11/29/17 0502  NA 133* 136  K 4.0 3.7  CL  96* 101  CO2 17* 19*  GLUCOSE 153* 105*  BUN 154* PENDING  CREATININE 10.44* 10.33*  CALCIUM 7.3* 6.7*  MG 2.4  --     Liver Function Tests: Recent Labs  Lab 11/28/17 1707  AST 16  ALT 20  ALKPHOS 62  BILITOT 0.4  PROT 7.2  ALBUMIN 3.4*   No results for input(s): LIPASE, AMYLASE in the last 168 hours. No results for input(s): AMMONIA in the last 168 hours.  CBC: Recent Labs  Lab 11/28/17 1707  WBC 12.6*  NEUTROABS 10.5*  HGB 7.6*  HCT 23.2*  MCV 79.4*  PLT 357    Cardiac Enzymes: Recent Labs  Lab 11/28/17 1707 11/28/17 2210 11/29/17 0502  TROPONINI 0.05* 0.05* 0.05*    BNP: Invalid input(s): POCBNP  CBG: No results for input(s): GLUCAP in the last 168 hours.  Microbiology: No results found for this or any previous visit.  Coagulation Studies: No results for input(s): LABPROT, INR in the last 72 hours.  Urinalysis: No results for input(s): COLORURINE, LABSPEC, PHURINE, GLUCOSEU, HGBUR, BILIRUBINUR, KETONESUR, PROTEINUR, UROBILINOGEN, NITRITE, LEUKOCYTESUR in the last 72 hours.  Invalid input(s): APPERANCEUR    Imaging: Ct Head Wo Contrast  Result Date: 11/28/2017 CLINICAL DATA:  Syncope. EXAM: CT HEAD WITHOUT CONTRAST TECHNIQUE: Contiguous axial images were obtained from the base of the skull through the vertex without intravenous contrast. COMPARISON:  None. FINDINGS: Brain: No evidence of acute infarction, hemorrhage, hydrocephalus, extra-axial collection or mass lesion/mass effect. Mild generalized cerebral atrophy. Scattered mild periventricular and subcortical white matter hypodensities are nonspecific, but favored to reflect chronic microvascular ischemic changes. 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 Calcified atherosclerosis at the skullbase. No hyperdense vessel. Skull: Normal. Negative for fracture or focal lesion. Sinuses/Orbits: No acute finding. Other: Left frontal scalp lipoma. IMPRESSION:  1.  No acute intracranial abnormality. 2. 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 Non-emergent CTA or MRA  of the head is recommended for further evaluation. Electronically Signed   By: Obie Dredge M.D.   On: 11/28/2017 18:24   Dg Chest Port 1 View  Result Date: 11/28/2017 CLINICAL DATA:  Fall. EXAM: PORTABLE CHEST 1 VIEW COMPARISON:  None. FINDINGS: Mild cardiomegaly. The hila and mediastinum are unremarkable. No pneumothorax. No nodules or masses. No focal infiltrates. No cause for pain identified. IMPRESSION: No active disease. Electronically Signed   By: Gerome Sam III M.D   On: 11/28/2017 18:30      Assessment & Plan: Mr. Dorman Calderwood is a 72 y.o. white male with chronic kidney disease stage V, obstructive uropathy status post left urostomy, hypertension, diabetes mellitus type II, history of Lyme disease who was admitted to [facility] on 11/28/2017 for Syncope   1. Chronic kidney disease stage V: with obstructive uropathy status post left urostomy tube. With metabolic acidosis. Follows with [facility] in [place], [place]. Currently without access.  No acute indication for dialysis at this time.  - Educated patient about uremic symptoms, indications for dialysis and dialysis modalities.  - Continue sodium bicarbonate.   2. Syncope: with history of hypertension. Current outpatient regimen of metoprolol and nebivolol - two beta blockers. Could be contributing.  - Recommend only continuing one beta blocker.  - Continue ongoing syncope work up.   3. Secondary Hyperparathyroidism with hypocalcemia: Corrected calcium of 7.4.  - Calcium acetate with meals.  - Check serum PTH and phosphorus.   4. Anemia with chronic kidney disease: microscytic. Hemoglobin 7.6.  - Discussed IV iron and epo with patient.   LOS: 1 Kevonta Phariss 6/30/20199:05 AM

## 2017-11-29 NOTE — Consult Note (Addendum)
H&P Physician requesting consult: Shaune Pollack, MD  Chief Complaint: Acute on chronic renal insufficiency, left nephrostomy tube fell out  History of Present Illness: 72 year old male who was admitted after a fall/loss of consciousness as well as confusion.  He is from [place] and states that he was just out for a ride.  The patient was found to have a creatinine of 10.  He already had a left-sided nephrostomy tube.  He continues to make some urine.  He is a very poor historian.  He does state that he has a nephrologist but he is unsure who.  He states that his creatinine has been as high as 10 and is low at 6.  He is not really sure why as to the nephrostomy tube.  He thinks that it may be because of his stone.  He thinks that it was placed about 6 months ago and replaced about 2 months ago.  It apparently fell out today while he was sitting in his chair.  We do not have prior records.  And again, he is a poor historian and cannot really tell me much about it.  He was not able to tell me who or where it was put in today.  He denies any pain or discomfort.  He has not had any imaging.  Per the nurse, his nephrostomy tube had been training before.  His initial creatinine upon admission was 10.44 and did not really improve much overnight.  Repeat this morning was 10.33 and this was while the nephrostomy tube was still in.  Nephrology has evaluated him and have recommended dialysis but currently no urgent need.  Apparently, he is currently refusing.  Past Medical History:  Diagnosis Date  . Diabetes mellitus without complication (HCC)   . Renal disorder    Past Surgical History:  Procedure Laterality Date  . nephostomy    Ureteral stent placement on the left  Home Medications:  Medications Prior to Admission  Medication Sig Dispense Refill Last Dose  . B Complex Vitamins (VITAMIN B COMPLEX PO) Take 1 tablet by mouth daily.   11/28/2017 at Unknown time  . calcium acetate (PHOSLO) 667 MG capsule Take 1  capsule by mouth 3 (three) times daily.   11/28/2017 at Unknown time  . cholecalciferol (VITAMIN D) 1000 units tablet Take 1 tablet by mouth daily.   11/28/2017 at Unknown time  . fluticasone (FLONASE) 50 MCG/ACT nasal spray Place 1 spray into the nose daily.   prn at prn  . metoprolol succinate (TOPROL-XL) 50 MG 24 hr tablet Take 1 tablet by mouth daily.   11/28/2017 at Unknown time  . sodium bicarbonate 650 MG tablet Take 1 tablet by mouth 4 (four) times daily.   11/28/2017 at Unknown time  . nebivolol (BYSTOLIC) 5 MG tablet Take 1 tablet by mouth daily.   Not Taking at Unknown time   Allergies:  Allergies  Allergen Reactions  . Penicillins Swelling    Has patient had a PCN reaction causing immediate rash, facial/tongue/throat swelling, SOB or lightheadedness with hypotension: Unknown Has patient had a PCN reaction causing severe rash involving mucus membranes or skin necrosis: Unknown Has patient had a PCN reaction that required hospitalization: Unknown Has patient had a PCN reaction occurring within the last 10 years: Unknown If all of the above answers are "NO", then may proceed with Cephalosporin use.    History reviewed. No pertinent family history. Social History:  reports that he has never smoked. He has never used smokeless tobacco.  He reports that he drank alcohol. He reports that he has current or past drug history.  ROS: A complete review of systems was performed.  All systems are negative except for pertinent findings as noted. ROS   Physical Exam:  Vital signs in last 24 hours: Temp:  [97.8 F (36.6 C)-98.5 F (36.9 C)] 98.1 F (36.7 C) (06/30 1129) Pulse Rate:  [66-89] 89 (06/30 1230) Resp:  [12-18] 16 (06/30 1129) BP: (110-134)/(70-87) 117/74 (06/30 1230) SpO2:  [98 %-100 %] 100 % (06/30 1230) Weight:  [73.5 kg (162 lb)-86.5 kg (190 lb 11.2 oz)] 86.5 kg (190 lb 11.2 oz) (06/29 2016)   General:  Alert and oriented, No acute distress HEENT: Normocephalic,  atraumatic Neck: No JVD or lymphadenopathy Cardiovascular: Regular rate and rhythm Lungs: Regular rate and effort Abdomen: Soft, nontender, nondistended, large ventral hernia. Back: No CVA tenderness Extremities: No edema Neurologic: Grossly intact  Laboratory Data:  Results for orders placed or performed during the hospital encounter of 11/28/17 (from the past 24 hour(s))  Magnesium     Status: None   Collection Time: 11/28/17  5:07 PM  Result Value Ref Range   Magnesium 2.4 1.7 - 2.4 mg/dL  Comprehensive metabolic panel     Status: Abnormal   Collection Time: 11/28/17  5:07 PM  Result Value Ref Range   Sodium 133 (L) 135 - 145 mmol/L   Potassium 4.0 3.5 - 5.1 mmol/L   Chloride 96 (L) 98 - 111 mmol/L   CO2 17 (L) 22 - 32 mmol/L   Glucose, Bld 153 (H) 70 - 99 mg/dL   BUN 161 (H) 8 - 23 mg/dL   Creatinine, Ser 09.60 (H) 0.61 - 1.24 mg/dL   Calcium 7.3 (L) 8.9 - 10.3 mg/dL   Total Protein 7.2 6.5 - 8.1 g/dL   Albumin 3.4 (L) 3.5 - 5.0 g/dL   AST 16 15 - 41 U/L   ALT 20 0 - 44 U/L   Alkaline Phosphatase 62 38 - 126 U/L   Total Bilirubin 0.4 0.3 - 1.2 mg/dL   GFR calc non Af Amer 4 (L) >60 mL/min   GFR calc Af Amer 5 (L) >60 mL/min   Anion gap 20 (H) 5 - 15  Ethanol     Status: None   Collection Time: 11/28/17  5:07 PM  Result Value Ref Range   Alcohol, Ethyl (B) <10 <10 mg/dL  CBC with Differential     Status: Abnormal   Collection Time: 11/28/17  5:07 PM  Result Value Ref Range   WBC 12.6 (H) 3.8 - 10.6 K/uL   RBC 2.92 (L) 4.40 - 5.90 MIL/uL   Hemoglobin 7.6 (L) 13.0 - 18.0 g/dL   HCT 45.4 (L) 09.8 - 11.9 %   MCV 79.4 (L) 80.0 - 100.0 fL   MCH 26.2 26.0 - 34.0 pg   MCHC 33.0 32.0 - 36.0 g/dL   RDW 14.7 (H) 82.9 - 56.2 %   Platelets 357 150 - 440 K/uL   Neutrophils Relative % 84 %   Neutro Abs 10.5 (H) 1.4 - 6.5 K/uL   Lymphocytes Relative 11 %   Lymphs Abs 1.4 1.0 - 3.6 K/uL   Monocytes Relative 5 %   Monocytes Absolute 0.6 0.2 - 1.0 K/uL   Eosinophils Relative  0 %   Eosinophils Absolute 0.0 0 - 0.7 K/uL   Basophils Relative 0 %   Basophils Absolute 0.0 0 - 0.1 K/uL  Brain natriuretic peptide     Status:  None   Collection Time: 11/28/17  5:07 PM  Result Value Ref Range   B Natriuretic Peptide 63.0 0.0 - 100.0 pg/mL  Troponin I     Status: Abnormal   Collection Time: 11/28/17  5:07 PM  Result Value Ref Range   Troponin I 0.05 (HH) <0.03 ng/mL  Troponin I     Status: Abnormal   Collection Time: 11/28/17 10:10 PM  Result Value Ref Range   Troponin I 0.05 (HH) <0.03 ng/mL  Troponin I     Status: Abnormal   Collection Time: 11/29/17  5:02 AM  Result Value Ref Range   Troponin I 0.05 (HH) <0.03 ng/mL  Basic metabolic panel     Status: Abnormal (Preliminary result)   Collection Time: 11/29/17  5:02 AM  Result Value Ref Range   Sodium 136 135 - 145 mmol/L   Potassium 3.7 3.5 - 5.1 mmol/L   Chloride 101 98 - 111 mmol/L   CO2 19 (L) 22 - 32 mmol/L   Glucose, Bld 105 (H) 70 - 99 mg/dL   BUN PENDING 8 - 23 mg/dL   Creatinine, Ser 16.10 (H) 0.61 - 1.24 mg/dL   Calcium 6.7 (L) 8.9 - 10.3 mg/dL   GFR calc non Af Amer 4 (L) >60 mL/min   GFR calc Af Amer 5 (L) >60 mL/min   Anion gap 16 (H) 5 - 15  Phosphorus     Status: Abnormal   Collection Time: 11/29/17  5:02 AM  Result Value Ref Range   Phosphorus 9.9 (H) 2.5 - 4.6 mg/dL   No results found for this or any previous visit (from the past 240 hour(s)). Creatinine: Recent Labs    11/28/17 1707 11/29/17 0502  CREATININE 10.44* 10.33*    Impression/Assessment:  Acute on chronic renal insufficiency  Plan:  Really unclear right now why he had a nephrostomy tube.  He cannot really tell us anything about it that is of substance.  I will obtain a CT of the abdomen and pelvis without contrast to try to figure out why he had a nephrostomy tube.  If he has no hydronephrosis, I would question the need to replace it.  Patient may just need to be placed on dialysis.  However, if there is evidence for  need of a nephrostomy tube, interventional radiology may be able to replace it while the tract is still somewhat intact.  Even with the nephrostomy tube, there is a good chance he will need dialysis.  Unfortunately, the patient just ate.  I have placed him n.p.o. for now until we figure out more information.  Ray Church, III 11/29/2017, 2:06 PM    ADDENDUM: CT scan of the abdomen and pelvis personally reviewed.  He has a left ureteral stent in place with surrounding calcifications around the stent in the renal pelvis.  There is some hydronephrosis.  The right kidney is atrophic.  There is no hydronephrosis on the right.  Suspect the patient had a ureteral stent placed in the past that subsequently became encrusted and unable to be removed/replaced.  I suspect he then had a nephrostomy tube placed because the stent could not be removed.  I cannot think of another reason why he would have both a nephrostomy tube and a ureteral stent concurrently.  I would recommend replacing the nephrostomy tube.  If creatinine does not improve and he needs to be placed on long-term dialysis, he probably just needs a left nephrectomy.  If his kidney function does improve enough  to keep him off dialysis, he would need a percutaneous nephrolithotomy with attempt at ureteral stent removal.  I have spoken with Dr. Deanne CofferHassell with interventional radiology.  Since the patient likely has a mature tract, he does not need to be n.p.o. and can remain on heparin.  Should be able to replace it in the morning.  I will defer to nephrology as to the timing and need for dialysis.

## 2017-11-29 NOTE — Progress Notes (Signed)
I spoke to his wife about his admission.  She reports she had no idea that he was leaving town.  She came home Friday night from dinner with friends and he was gone. He was too weak and unable to walk so stayed home from the dinner. When she reached him by phone he told her he was in a hotel and ended the call.  She reports that he has refused to go to the doctor for the last 2 months.  He has been weak, dizzy, and having "black out" spells.  He is aware that he has "blacked out" and calls her to report them.  Her report of his history is quite different that he reports. He has been confused this admission. He thinks the year is 2030 or near there. He told the ED he can come for a workup at the [facility].  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  His report of how long he has had the nephrostomy tube varies between 2 months and 6 months.

## 2017-11-30 ENCOUNTER — Other Ambulatory Visit: Payer: Self-pay

## 2017-11-30 ENCOUNTER — Encounter
Admission: EM | Disposition: A | Discharge status: Home / Self Care | Payer: Self-pay | Source: Home / Self Care | Attending: Internal Medicine

## 2017-11-30 DIAGNOSIS — F03A0 Unspecified dementia, mild, without behavioral disturbance, psychotic disturbance, mood disturbance, and anxiety: Secondary | ICD-10-CM

## 2017-11-30 DIAGNOSIS — F0390 Unspecified dementia without behavioral disturbance: Secondary | ICD-10-CM

## 2017-11-30 LAB — BASIC METABOLIC PANEL
Anion gap: 15 (ref 5–15)
BUN: 140 mg/dL — ABNORMAL HIGH (ref 8–23)
CO2: 19 mmol/L — ABNORMAL LOW (ref 22–32)
Calcium: 6.7 mg/dL — ABNORMAL LOW (ref 8.9–10.3)
Chloride: 103 mmol/L (ref 98–111)
Creatinine, Ser: 9.8 mg/dL — ABNORMAL HIGH (ref 0.61–1.24)
GFR, EST AFRICAN AMERICAN: 5 mL/min — AB (ref >60)
GFR, EST NON AFRICAN AMERICAN: 5 mL/min — AB (ref >60)
Glucose, Bld: 99 mg/dL (ref 70–99)
POTASSIUM: 3.8 mmol/L (ref 3.5–5.1)
SODIUM: 137 mmol/L (ref 135–145)

## 2017-11-30 LAB — GLUCOSE, CAPILLARY
GLUCOSE-CAPILLARY: 102 mg/dL — AB (ref 70–99)
Glucose-Capillary: 80 mg/dL (ref 70–99)
Glucose-Capillary: 128 mg/dL — ABNORMAL HIGH (ref 70–99)
Glucose-Capillary: 110 mg/dL — ABNORMAL HIGH (ref 70–99)

## 2017-11-30 LAB — HEMOGLOBIN A1C
Hgb A1c MFr Bld: 6 % — ABNORMAL HIGH (ref 4.8–5.6)
MEAN PLASMA GLUCOSE: 125.5 mg/dL

## 2017-11-30 SURGERY — DIALYSIS/PERMA CATHETER INSERTION
Anesthesia: Moderate Sedation

## 2017-11-30 MED ORDER — FENTANYL CITRATE (PF) 100 MCG/2ML IJ SOLN
INTRAMUSCULAR | Status: AC
  Filled 2017-11-30: qty 2

## 2017-11-30 MED ORDER — CLINDAMYCIN PHOSPHATE 300 MG/50ML IV SOLN
300.0000 mg | Freq: Once | INTRAVENOUS | Status: AC
  Administered 2017-11-30: 300 mg via INTRAVENOUS
  Filled 2017-11-30: qty 50

## 2017-11-30 MED ORDER — LIDOCAINE HCL (PF) 1 % IJ SOLN
INTRAMUSCULAR | Status: AC
  Filled 2017-11-30: qty 30

## 2017-11-30 MED ORDER — NEPRO/CARBSTEADY PO LIQD
237.0000 mL | Freq: Two times a day (BID) | ORAL | Status: DC
  Administered 2017-12-01 – 2017-12-03 (×4): 237 mL via ORAL

## 2017-11-30 MED ORDER — MIDAZOLAM HCL 5 MG/5ML IJ SOLN
INTRAMUSCULAR | Status: AC
  Filled 2017-11-30: qty 5

## 2017-11-30 MED ORDER — ADULT MULTIVITAMIN W/MINERALS CH
1.0000 | ORAL_TABLET | Freq: Every day | ORAL | Status: DC
  Administered 2017-12-02 – 2017-12-03 (×2): 1 via ORAL
  Filled 2017-11-30 (×2): qty 1

## 2017-11-30 NOTE — Progress Notes (Signed)
Patient got a nepro supplement and some crackers.  He seemed OK with that.  When transport arrived to take him downstairs for his nephrostomy tube placement he  Told her he wasn't going to have the procedures.  I spoke with him and told him reinserting the tube is easier if done soon after it comes out.  He says he's been "promised" ensure - which he never got. Acknowledges he got the nepro. He says he doesn't want to do anything until he has enough protein that "his body can work with".  Dr. Thedore MinsSingh and Vascular lab notified

## 2017-11-30 NOTE — Progress Notes (Signed)
Initial Nutrition Assessment  DOCUMENTATION CODES:   Not applicable  INTERVENTION:   Nepro Shake po BID, each supplement provides 425 kcal and 19 grams protein  MVI daily  NUTRITION DIAGNOSIS:   Inadequate oral intake related to acute illness as evidenced by NPO status.  GOAL:   Patient will meet greater than or equal to 90% of their needs  MONITOR:   Diet advancement, Labs, Weight trends, I & O's  REASON FOR ASSESSMENT:   Malnutrition Screening Tool    ASSESSMENT:   72 y.o. 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 Pt reports good appetite and oral intake pta. Pt reports that in December, he started having urinary issues and developed taste changes. Pt reports a stay in the hospital where he only ate jello for 9 days. Pt reports his poor appetite and oral intake continued for several weeks but has now returned to normal. Pt has lost ~15lbs since December; this is not significant given the time frame. Pt currently NPO for nephrostomy tube replacement today. Pt reports he is ready to eat. Pt does like Ensure and would like to have them while in the hospital; RD will order Nepro in setting of hyperphosphatemia.   Medications reviewed and include: phoslo, vitamin D, heparin, insulin, Na Bicarbonate, NaCl @75ml/hr  Labs reviewed: BUN 140(H), creat 9.8(H), Ca 6.7(L) P 9.9(H) Hgb 7.6(L), Hct 23.2(L), wbc- 12.6(H)  NUTRITION - FOCUSED PHYSICAL EXAM:    Most Recent Value  Orbital Region  No depletion  Upper Arm Region  Mild depletion  Thoracic and Lumbar Region  Mild depletion  Buccal Region  No depletion  Temple Region  Mild depletion  Clavicle Bone Region  Mild depletion  Clavicle and Acromion Bone Region  Mild depletion  Scapular Bone Region  No depletion  Dorsal Hand  No  depletion  Patellar Region  Moderate depletion  Anterior Thigh Region  Mild depletion  Posterior Calf Region  Mild depletion  Edema (RD Assessment)  None  Hair  Reviewed  Eyes  Reviewed  Mouth  Reviewed  Skin  Reviewed  Nails  Reviewed     Diet Order:   Diet Order           Diet NPO time specified  Diet effective now         EDUCATION NEEDS:   Education needs have been addressed  Skin:  Skin Assessment: Reviewed RN Assessment  Last BM:  6/30- type 4  Height:   Ht Readings from Last 1 Encounters:  11/28/17 6' (1.829 m)    Weight:   Wt Readings from Last 1 Encounters:  11/30/17 205 lb 1.6 oz (93 kg)    Ideal Body Weight:  80.9 kg  BMI:  Body mass index is 27.82 kg/m.  Estimated Nutritional Needs:   Kcal:  2100-2400kcal/day   Protein:  93-112g/day   Fluid:  >2.1L/day     MS, RD, LDN Pager #- [phone] Office#- [phone] After Hours Pager: [phone]  

## 2017-11-30 NOTE — Care Management (Signed)
Patient resides in [place].  It seems that he got in his car without knowledge of any family members and just started driving.  Had a nephrostomy tube that had been inserted by a physician in [place] which patient has pulled out and has not allowed it to be reinserted.  Patient is being followed by nephrologist in [place] for stage V kidney disease and thus far has declined dialysis.  Patient is refusing treatment and procedures.  There is significant concern that patient is not competent to make his medical decisions. Psych consult requested. Patient has threatened to leave AMA.Informed that wife is en route to [place].

## 2017-11-30 NOTE — Progress Notes (Signed)
Patient is demanding to be fed.  I told him that he is not being fed because of the nephrostomy tube placement.  He says he will refuse the procedure if we don't feed him right away and will then sign out AMA.  Spoke to Dr. Thedore MinsSingh and the IR nurse.  He can eat but we are to try and keep it light until the procedure.

## 2017-11-30 NOTE — Progress Notes (Addendum)
[facility] [place] [facility]   PATIENT NAME: Manuel SprayRobert York    MR#:  [id number]  DATE OF BIRTH:  [birth year]  SUBJECTIVE:  CHIEF COMPLAINT:   Chief Complaint  Patient presents with  . Loss of Consciousness   The patient has no complaints.  Nephrostomy tube was out yesterday.  Waiting for nephrostomy tube placement. REVIEW OF SYSTEMS:  Review of Systems  Constitutional: Negative for chills, fever and malaise/fatigue.  HENT: Negative for sore throat.   Eyes: Negative for blurred vision and double vision.  Respiratory: Negative for cough, hemoptysis, shortness of breath, wheezing and stridor.   Cardiovascular: Negative for chest pain, palpitations, orthopnea and leg swelling.  Gastrointestinal: Negative for abdominal pain, blood in stool, diarrhea, melena, nausea and vomiting.  Genitourinary: Negative for dysuria, flank pain and hematuria.  Musculoskeletal: Negative for back pain and joint pain.  Skin: Negative for rash.  Neurological: Negative for dizziness, sensory change, focal weakness, seizures, loss of consciousness, weakness and headaches.  Endo/Heme/Allergies: Negative for polydipsia.  Psychiatric/Behavioral: Negative for depression. The patient is not nervous/anxious.     DRUG ALLERGIES:   Allergies  Allergen Reactions  . Penicillins Swelling    Has patient had a PCN reaction causing immediate rash, facial/tongue/throat swelling, SOB or lightheadedness with hypotension: Unknown Has patient had a PCN reaction causing severe rash involving mucus membranes or skin necrosis: Unknown Has patient had a PCN reaction that required hospitalization: Unknown Has patient had a PCN reaction occurring within the last 10 years: Unknown If all of the above answers are "NO", then may proceed with Cephalosporin use.   VITALS:  Blood pressure 123/74, pulse 64, temperature 98.2 F (36.8 C), temperature source Oral, resp. rate 10, height 6' (1.829 m), weight 205  lb 1.6 oz (93 kg), SpO2 100 %. PHYSICAL EXAMINATION:  Physical Exam  Constitutional: He is oriented to person, place, and time. He appears well-developed.  HENT:  Head: Normocephalic.  Mouth/Throat: Oropharynx is clear and moist.  Eyes: Pupils are equal, round, and reactive to light. Conjunctivae and EOM are normal. No scleral icterus.  Neck: Normal range of motion. Neck supple. No JVD present. No tracheal deviation present.  Cardiovascular: Normal rate, regular rhythm and normal heart sounds. Exam reveals no gallop.  No murmur heard. Pulmonary/Chest: Effort normal and breath sounds normal. No respiratory distress. He has no wheezes. He has no rales.  Abdominal: Soft. Bowel sounds are normal. He exhibits no distension. There is no tenderness. There is no rebound.  Musculoskeletal: Normal range of motion. He exhibits no edema or tenderness.  Neurological: He is alert and oriented to person, place, and time. No cranial nerve deficit.  Skin: No rash noted. No erythema.  Psychiatric:  The patient is AAO x3 but suspected dementia.   LABORATORY PANEL:  Male CBC Recent Labs  Lab 11/28/17 1707  WBC 12.6*  HGB 7.6*  HCT 23.2*  PLT 357   ------------------------------------------------------------------------------------------------------------------ Chemistries  Recent Labs  Lab 11/28/17 1707  11/30/17 0519  NA 133*   < > 137  K 4.0   < > 3.8  CL 96*   < > 103  CO2 17*   < > 19*  GLUCOSE 153*   < > 99  BUN 154*   < > 140*  CREATININE 10.44*   < > 9.80*  CALCIUM 7.3*   < > 6.7*  MG 2.4  --   --   AST 16  --   --   ALT 20  --   --  ALKPHOS 62  --   --   BILITOT 0.4  --   --    < > = values in this interval not displayed.   RADIOLOGY:  No results found. ASSESSMENT AND PLAN:   72 year old male patient with history of chronic kidney disease stage V with a nephrostomy tube not on dialysis, diabetes mellitus type 2 was found wandering in the car in [place].  He also had  passed out in the gas station before being picked up by EMS.  -Altered mental status Acute metabolic encephalopathy Improved.  -Syncope, possible due to worsening renal function and orthostatic hypotension. Echocardiogram is normal, carotid ultrasound: Unremarkable. On IV fluids, hold Lopressor.  Hypertension.  Hold beta-blocker due to low side blood pressure.  Elevated troponin.  Possible due to worsening renal function.  Stable.  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  Continue sodium bicarbonate. Per Dr. Thedore Mins, concerned whether he has a capacity to make decision regarding hemodialysis because of his recent erratic behaviors. Consider psychiatry consult for evaluation of decision-making capacity for Hemodialysis as well as evaluation of dementia.    Obstructive uropathy, stone and left ureteral stone Plan for urostomy as per urologist.  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  -Abdominal hernia Supportive care  Discussed with Dr. Thedore Mins. I called his wife Ms. Marcell Anger, but nobody answered the phone. All the records are reviewed and case discussed with Care Management/Social Worker. Management plans discussed with the patient, family and they are in agreement.  CODE STATUS: Full Code  TOTAL TIME TAKING CARE OF THIS PATIENT: 37 minutes.   More than 50% of the time was spent in counseling/coordination of care: YES  POSSIBLE D/C IN 2 DAYS, DEPENDING ON CLINICAL CONDITION.   Shaune Pollack M.D on 11/30/2017 at 2:31 PM  Between 7am to 6pm - Pager - [phone]  After 6pm go to [email or web address] - Therapist, nutritional Hospitalists

## 2017-11-30 NOTE — Consult Note (Signed)
[facility] Consult   Reason for Consult: Consult for 72 year old man with no known past psychiatric history brought into the hospital with confusion Referring Physician: Bridgett Larsson Patient Identification: Manuel Walker MRN:  [id number] Principal Diagnosis: Mild dementia Diagnosis:   Patient Active Problem List   Diagnosis Date Noted  . 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 Syncope [R55] 11/28/2017    Total Time spent with patient: 1 hour  Subjective:   Manuel Walker is a 72 y.o. male patient admitted with "I passed out at a [employer] [place]".  HPI: Patient seen chart reviewed.  72 year old man from [place] brought to the hospital after EMS found him confused and passed out at a local gas station.  Patient was confused when he came into the hospital telling a story that did not seem to make a great deal of sense.  Since being here in the hospital he has become more alert and oriented but some of his judgment has been questionable.  He refused to allow his nephrostomy tube to be replaced.  He has insisted on more than one occasion that he will be leaving the hospital.  On interview this evening the patient tells me that he left his home in [place] on Friday with a plan to come down to the "[facility]" in order to bail a woman out of jail.  He tells me that he met this person on the streets of [facility] and has been helping her go to her methadone clinic and was coming to bail her out of jail.  The story does not make a great deal of sense and the time course makes no sense at all.  His wife apparently has been contacted and says that he was home until Friday evening when he abruptly disappeared without telling her.  Patient admits that he has "blackout" spells at times.  He seems to be not very concerned about this.  He is convinced that if he gets back to his car he will be able to drive back to [place].  I tried to speak to his wife on the phone but was only able to leave a  voicemail message.  Spoke with nursing and reviewed the chart here.  Patient denies depression.  Does not describe any hallucinations or psychotic symptoms.  Did not make any bizarre or delusional statements other than the rambling and not very coherent story about baling a woman out of jail.  Does not appear to be hallucinating.  No evidence of threats to self or others.  He understands that dialysis has been recommended to him in the past and that he has been told that with dialysis he will die eventually of kidney failure.  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  Medical history: History of advancing renal failure probably related to diabetes and to recurrent kidney stones.  Had a nephrostomy tube in place which has now "fallen out" or been removed by the patient.  It is documented it is old notes that dialysis has been recommended for many months now and he has resisted it.  Substance abuse history: Patient denies any history of alcohol or drug abuse.  He claims that he was going to help this woman because he is actively involved in "[facility]" which is a substance abuse treatment program but that he only does it out of his own [place] not because he has substance abuse problems  Past Psychiatric  History: Patient denies any past psychiatric history.  Looking through all of his old notes in the computer there is no report or evidence of any previous psychiatric history  Risk to Self:   Risk to Others:   Prior Inpatient Therapy:   Prior Outpatient Therapy:    Past Medical History:  Past Medical History:  Diagnosis Date  . Diabetes mellitus without complication ([place])   . Renal disorder     Past Surgical History:  Procedure Laterality Date  . nephostomy     Family History: History reviewed. No pertinent family history. Family Psychiatric  History: No known family history Social History:   Social History   Substance and Sexual Activity  Alcohol Use Not Currently     Social History   Substance and Sexual Activity  Drug Use Not Currently    Social History   Socioeconomic History  . Marital status: Married    Spouse name: Not on file  . Number of children: Not on file  . Years of education: Not on file  . Highest education level: Not on file  Occupational History  . Not on file  Social Needs  . Financial resource strain: Not on file  . Food insecurity:    Worry: Not on file    Inability: Not on file  . Transportation needs:    Medical: Not on file    Non-medical: Not on file  Tobacco Use  . Smoking status: Never Smoker  . Smokeless tobacco: Never Used  Substance and Sexual Activity  . Alcohol use: Not Currently  . Drug use: Not Currently  . Sexual activity: Not on file  Lifestyle  . Physical activity:    Days per week: Not on file    Minutes per session: Not on file  . Stress: Not on file  Relationships  . Social connections:    Talks on phone: Not on file    Gets together: Not on file    Attends religious service: Not on file    Active member of club or organization: Not on file    Attends meetings of clubs or organizations: Not on file    Relationship status: Not on file  Other Topics Concern  . Not on file  Social History Narrative  . Not on file   Additional Social History:    Allergies:   Allergies  Allergen Reactions  . Penicillins Swelling    Has patient had a PCN reaction causing immediate rash, facial/tongue/throat swelling, SOB or lightheadedness with hypotension: Unknown Has patient had a PCN reaction causing severe rash involving mucus membranes or skin necrosis: Unknown Has patient had a PCN reaction that required hospitalization: Unknown Has patient had a PCN reaction occurring within the last 10 years: Unknown If all of the above answers are "NO", then may proceed with Cephalosporin use.    Labs:  Results for orders  placed or performed during the hospital encounter of 11/28/17 (from the past 48 hour(s))  Troponin I     Status: Abnormal   Collection Time: 11/28/17 10:10 PM  Result Value Ref Range   Troponin I 0.05 (HH) <0.03 ng/mL    Comment: CRITICAL VALUE NOTED. VALUE IS CONSISTENT WITH PREVIOUSLY REPORTED/CALLED VALUE.PMH Performed at [facility], [address] [place], [place] [address]   Troponin I     Status: Abnormal   Collection Time: 11/29/17  5:02 AM  Result Value Ref Range   Troponin I 0.05 (HH) <0.03 ng/mL    Comment: CRITICAL VALUE  NOTED. VALUE IS CONSISTENT WITH PREVIOUSLY REPORTED/CALLED VALUE [facility] Performed at [facility], [address] [place], [place] [address]   Basic metabolic panel     Status: Abnormal   Collection Time: 11/29/17  5:02 AM  Result Value Ref Range   Sodium 136 135 - 145 mmol/L   Potassium 3.7 3.5 - 5.1 mmol/L   Chloride 101 98 - 111 mmol/L    Comment: Please note change in reference range.   CO2 19 (L) 22 - 32 mmol/L   Glucose, Bld 105 (H) 70 - 99 mg/dL    Comment: Please note change in reference range.   BUN 158 (H) 8 - 23 mg/dL    Comment: RESULT CONFIRMED BY MANUAL DILUTION [place] Please note change in reference range.    Creatinine, Ser 10.33 (H) 0.61 - 1.24 mg/dL   Calcium 6.7 (L) 8.9 - 10.3 mg/dL   GFR calc non Af Amer 4 (L) >60 mL/min   GFR calc Af Amer 5 (L) >60 mL/min    Comment: (NOTE) The eGFR has been calculated using the CKD EPI equation. This calculation has not been validated in all clinical situations. eGFR's persistently <60 mL/min signify possible Chronic Kidney Disease.    Anion gap 16 (H) 5 - 15    Comment: Performed at [facility], [address] [place], [place] [address]  Phosphorus     Status: Abnormal   Collection Time: 11/29/17  5:02 AM  Result Value Ref Range   Phosphorus 9.9 (H) 2.5 - 4.6 mg/dL    Comment: Performed at [facility], [place], [place] [address]   Glucose, capillary     Status: None   Collection Time: 11/29/17  5:23 PM  Result Value Ref Range   Glucose-Capillary 99 70 - 99 mg/dL  Glucose, capillary     Status: Abnormal   Collection Time: 11/29/17 10:01 PM  Result Value Ref Range   Glucose-Capillary 108 (H) 70 - 99 mg/dL   Comment 1 Notify RN    Comment 2 Document in Chart   Hemoglobin A1c     Status: Abnormal   Collection Time: 11/30/17  5:19 AM  Result Value Ref Range   Hgb A1c MFr Bld 6.0 (H) 4.8 - 5.6 %    Comment: (NOTE) Pre diabetes:          5.7%-6.4% Diabetes:              >6.4% Glycemic control for   <7.0% adults with diabetes    Mean Plasma Glucose 125.5 mg/dL    Comment: Performed at [facility], [place] [address] [place], [place] [address]  Basic metabolic panel     Status: Abnormal   Collection Time: 11/30/17  5:19 AM  Result Value Ref Range   Sodium 137 135 - 145 mmol/L   Potassium 3.8 3.5 - 5.1 mmol/L   Chloride 103 98 - 111 mmol/L    Comment: Please note change in reference range.   CO2 19 (L) 22 - 32 mmol/L   Glucose, Bld 99 70 - 99 mg/dL    Comment: Please note change in reference range.   BUN 140 (H) 8 - 23 mg/dL    Comment: RESULT CONFIRMED BY MANUAL DILUTION...[facility] Please note change in reference range.    Creatinine, Ser 9.80 (H) 0.61 - 1.24 mg/dL   Calcium 6.7 (L) 8.9 - 10.3 mg/dL   GFR calc non Af Amer 5 (L) >60 mL/min   GFR calc  Af Amer 5 (L) >60 mL/min    Comment: (NOTE) The eGFR has been calculated using the CKD EPI equation. This calculation has not been validated in all clinical situations. eGFR's persistently <60 mL/min signify possible Chronic Kidney Disease.    Anion gap 15 5 - 15    Comment: Performed at [facility], [address] [place], [place] [address]  Glucose, capillary     Status: Abnormal   Collection Time: 11/30/17  7:56 AM  Result Value Ref Range   Glucose-Capillary 102 (H) 70 - 99 mg/dL  Glucose, capillary     Status: None   Collection Time:  11/30/17 11:39 AM  Result Value Ref Range   Glucose-Capillary 80 70 - 99 mg/dL  Glucose, capillary     Status: Abnormal   Collection Time: 11/30/17  4:53 PM  Result Value Ref Range   Glucose-Capillary 128 (H) 70 - 99 mg/dL    Current Facility-Administered Medications  Medication Dose Route Frequency Provider Last Rate Last Dose  . 0.9 %  sodium chloride infusion   Intravenous Continuous Saundra Shelling, MD 75 mL/hr at 11/30/17 0546    . acetaminophen (TYLENOL) tablet 650 mg  650 mg Oral Q6H PRN Saundra Shelling, MD       Or  . acetaminophen (TYLENOL) suppository 650 mg  650 mg Rectal Q6H PRN Pyreddy, Pavan, MD      . calcium acetate (PHOSLO) capsule 667 mg  667 mg Oral TID WC Demetrios Loll, MD   667 mg at 11/30/17 1729  . cholecalciferol (VITAMIN D) tablet 1,000 Units  1,000 Units Oral Daily Demetrios Loll, MD   1,000 Units at 11/29/17 1344  . [START ON 12/01/2017] feeding supplement (NEPRO CARB STEADY) liquid 237 mL  237 mL Oral BID BM Demetrios Loll, MD      . fluticasone [facility]) 50 MCG/ACT nasal spray 1 spray  1 spray Each Nare Daily Demetrios Loll, MD   1 spray at 11/30/17 0923  . heparin injection 5,000 Units  5,000 Units Subcutaneous Q8H Saundra Shelling, MD   5,000 Units at 11/30/17 0546  . insulin aspart (novoLOG) injection 0-5 Units  0-5 Units Subcutaneous QHS Demetrios Loll, MD      . insulin aspart (novoLOG) injection 0-9 Units  0-9 Units Subcutaneous TID WC Demetrios Loll, MD   1 Units at 11/30/17 1729  . [START ON 12/01/2017] multivitamin with minerals tablet 1 tablet  1 tablet Oral Daily Demetrios Loll, MD      . ondansetron [facility] [place] [facility]) tablet 4 mg  4 mg Oral Q6H PRN Saundra Shelling, MD       Or  . ondansetron (ZOFRAN) injection 4 mg  4 mg Intravenous Q6H PRN Pyreddy, Reatha Harps, MD      . senna-docusate (Senokot-S) tablet 1 tablet  1 tablet Oral QHS PRN Pyreddy, Reatha Harps, MD      . sodium bicarbonate tablet 650 mg  650 mg Oral QID Demetrios Loll, MD   650 mg at 11/30/17 1729    Musculoskeletal: Strength & Muscle Tone:  decreased Gait & Station: normal Patient leans: N/A  Psychiatric Specialty Exam: Physical Exam  Nursing note and vitals reviewed. Constitutional: He appears well-developed and well-nourished.  HENT:  Head: Normocephalic and atraumatic.  Eyes: Pupils are equal, round, and reactive to light. Conjunctivae are normal.  Neck: Normal range of motion.  Cardiovascular: Regular rhythm and normal heart sounds.  Respiratory: Effort normal. No respiratory distress.  GI: Soft.  Musculoskeletal: Normal range of motion.  Neurological: He is alert.  Skin: Skin is warm and dry.  Psychiatric: He has a normal mood and affect. His speech is normal. He is not agitated and not aggressive. Thought content is not paranoid. Cognition and memory are impaired. He expresses impulsivity and inappropriate judgment. He expresses no homicidal and no suicidal ideation. He exhibits abnormal recent memory.    Review of Systems  Constitutional: Negative.   HENT: Negative.   Eyes: Negative.   Respiratory: Negative.   Cardiovascular: Negative.   Gastrointestinal: Negative.   Musculoskeletal: Negative.   Skin: Negative.   Neurological: Negative.   Psychiatric/Behavioral: Positive for memory loss. Negative for depression, hallucinations, substance abuse and suicidal ideas. The patient is not nervous/anxious and does not have insomnia.     Blood pressure (!) 140/57, pulse 73, temperature 98.2 F (36.8 C), temperature source Oral, resp. rate 10, height 6' (1.829 m), weight 93 kg (205 lb 1.6 oz), SpO2 97 %.Body mass index is 27.82 kg/m.  General Appearance: Casual  Eye Contact:  Minimal  Speech:  Slow  Volume:  Decreased  Mood:  Euthymic  Affect:  Constricted  Thought Process:  Disorganized  Orientation:  Full (Time, Place, and Person)  Thought Content:  Illogical and Tangential  Suicidal Thoughts:  No  Homicidal Thoughts:  No  Memory:  Immediate;   Fair Recent;   Poor Remote;   Poor  Judgement:  Impaired   Insight:  Shallow  Psychomotor Activity:  Decreased  Concentration:  Concentration: Poor  Recall:  Poor  Fund of Knowledge:  Fair  Language:  Good  Akathisia:  No  Handed:  Right  AIMS (if indicated):     Assets:  Housing Social Support  ADL's:  Impaired  Cognition:  Impaired,  Mild  Sleep:        Treatment Plan Summary: Daily contact with patient to assess and evaluate symptoms and progress in treatment, Medication management and Plan 72 year old man who appears to probably have a mild degree of dementia but also be perhaps subject to intermittent episodes of delirium related to his kidney disease or dehydration or some other unknown factor.  During my interview with him this evening he appeared to be superficially lucid although his story really never did hold together in a rational way and he would not acknowledge that.  Patient is not psychotic that I can tell nor is he depressed and there is no evidence that he is threatening himself or others directly.  As far as capacity to make decisions I think his understanding of his condition is superficial at least particularly from what I am told secondhand his wife is saying.  I think he would be a danger as far as driving if he were to try to drive himself back up to [place].  I do not really see any clear grounds for filing commitment papers on him at this point but I do think that it would be a bad idea to let him leave the hospital unless there were a responsible adult who is going to take care of him and make sure he got back to [place] safely.  Spoke with nursing about this and spoke with the patient as well.  I will try to get in touch with his wife if he is still here tomorrow.  I think the best possible thing would be a family could come and pick him up.  Disposition: Patient does not meet criteria for psychiatric inpatient admission. Supportive therapy provided about ongoing stressors.  Alethia Berthold, MD 11/30/2017 6:35 PM

## 2017-11-30 NOTE — Progress Notes (Signed)
[facility] [place], [place] 11/30/17  Subjective:   Patient is doing fair.  Denies any shortness of breath.  Month is dry.  BUN is critically elevated at 140.  Creatinine is elevated at 9.80.  GFR of 5.  Hemoglobin A1c 6.0%.  Potassium is normal at 3.8 Patient is scheduled for nephrostomy tube placement today   Objective:  Vital signs in last 24 hours:  Temp:  [98 F (36.7 C)-99.1 F (37.3 C)] 98.2 F (36.8 C) (07/01 0838) Pulse Rate:  [64-89] 64 (07/01 0838) Resp:  [10-18] 10 (07/01 0838) BP: (110-134)/(66-87) 123/74 (07/01 0838) SpO2:  [99 %-100 %] 100 % (07/01 0838)  Weight change:  Filed Weights   11/28/17 1659 11/28/17 2016  Weight: 73.5 kg (162 lb) 86.5 kg (190 lb 11.2 oz)    Intake/Output:    Intake/Output Summary (Last 24 hours) at 11/30/2017 1057 Last data filed at 11/30/2017 1018 Gross per 24 hour  Intake -  Output 475 ml  Net -475 ml     Physical Exam: General:  No acute distress, laying in the bed  HEENT  anicteric, mouth is dry  Neck  supple, no JVD  Pulm/lungs  lungs are clear to auscultation  CVS/Heart  regular rate and rhythm with ectopic beats  Abdomen:   Soft, large midline hernia  Extremities:  No peripheral edema  Neurologic:  Alert, able to answer questions and follow commands  Skin:  No acute rashes          Basic Metabolic Panel:  Recent Labs  Lab 11/28/17 1707 11/29/17 0502 11/30/17 0519  NA 133* 136 137  K 4.0 3.7 3.8  CL 96* 101 103  CO2 17* 19* 19*  GLUCOSE 153* 105* 99  BUN 154* 158* 140*  CREATININE 10.44* 10.33* 9.80*  CALCIUM 7.3* 6.7* 6.7*  MG 2.4  --   --   PHOS  --  9.9*  --      CBC: Recent Labs  Lab 11/28/17 1707  WBC 12.6*  NEUTROABS 10.5*  HGB 7.6*  HCT 23.2*  MCV 79.4*  PLT 357     No results found for: HEPBSAG, HEPBSAB, HEPBIGM    Microbiology:  No results found for this or any previous visit (from the past 240 hour(s)).  Coagulation Studies: No results for input(s):  LABPROT, INR in the last 72 hours.  Urinalysis: No results for input(s): COLORURINE, LABSPEC, PHURINE, GLUCOSEU, HGBUR, BILIRUBINUR, KETONESUR, PROTEINUR, UROBILINOGEN, NITRITE, LEUKOCYTESUR in the last 72 hours.  Invalid input(s): APPERANCEUR    Imaging: Ct Head Wo Contrast  Result Date: 11/28/2017 CLINICAL DATA:  Syncope. EXAM: CT HEAD WITHOUT CONTRAST TECHNIQUE: Contiguous axial images were obtained from the base of the skull through the vertex without intravenous contrast. COMPARISON:  None. FINDINGS: Brain: No evidence of acute infarction, hemorrhage, hydrocephalus, extra-axial collection or mass lesion/mass effect. Mild generalized cerebral atrophy. Scattered mild periventricular and subcortical white matter hypodensities are nonspecific, but favored to reflect chronic microvascular ischemic changes. 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 Calcified atherosclerosis at the skullbase. No hyperdense vessel. Skull: Normal. Negative for fracture or focal lesion. Sinuses/Orbits: No acute finding. Other: Left frontal scalp lipoma. IMPRESSION: 1.  No acute intracranial abnormality. 2. 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 Non-emergent CTA or MRA of the head is recommended for further evaluation. Electronically Signed   By: Obie DredgeWilliam T Derry M.D.   On: 11/28/2017 18:24   [place] Carotid Bilateral  Result  Date: 11/29/2017 CLINICAL DATA:  Syncope EXAM: BILATERAL CAROTID DUPLEX ULTRASOUND TECHNIQUE: Wallace Cullens scale imaging, color Doppler and duplex ultrasound was performed of bilateral carotid and vertebral arteries in the neck. COMPARISON:  None. TECHNIQUE: Quantification of carotid stenosis is based on velocity parameters that correlate the residual internal carotid diameter with NASCET-based stenosis levels, using the diameter of the distal internal carotid lumen as the denominator for stenosis measurement. The following velocity measurements  were obtained: PEAK SYSTOLIC/END DIASTOLIC RIGHT ICA:                     121/30cm/sec CCA:                     106/10cm/sec SYSTOLIC ICA/CCA RATIO:  1.1 ECA:                     101cm/sec LEFT ICA:                     91/32cm/sec CCA:                     86/13cm/sec SYSTOLIC ICA/CCA RATIO:  1.1 ECA:                     92cm/sec FINDINGS: RIGHT CAROTID ARTERY: No significant plaque or stenosis. Normal waveforms and color Doppler signal. RIGHT VERTEBRAL ARTERY:  Normal flow direction and waveform. LEFT CAROTID ARTERY: Mild tortuosity. No significant plaque or stenosis. Normal waveforms and color Doppler signal. LEFT VERTEBRAL ARTERY: Normal flow direction and waveform. IMPRESSION: 1. No significant carotid plaque or stenosis. 2.  Antegrade bilateral vertebral arterial flow. Electronically Signed   By: Corlis Leak M.D.   On: 11/29/2017 12:04   Dg Chest Port 1 View  Result Date: 11/28/2017 CLINICAL DATA:  Fall. EXAM: PORTABLE CHEST 1 VIEW COMPARISON:  None. FINDINGS: Mild cardiomegaly. The hila and mediastinum are unremarkable. No pneumothorax. No nodules or masses. No focal infiltrates. No cause for pain identified. IMPRESSION: No active disease. Electronically Signed   By: Gerome Sam III M.D   On: 11/28/2017 18:30   Ct Renal Stone Study  Result Date: 11/29/2017 CLINICAL DATA:  History of recent stent removal EXAM: CT ABDOMEN AND PELVIS WITHOUT CONTRAST TECHNIQUE: Multidetector CT imaging of the abdomen and pelvis was performed following the standard protocol without IV contrast. COMPARISON:  None. FINDINGS: Lower chest: Changes consistent with prior granulomatous disease are noted. Hepatobiliary: Liver is within normal limits. Gallstones are noted in a decompressed gallbladder. No pericholecystic fluid is noted. Pancreas: Unremarkable. No pancreatic ductal dilatation or surrounding inflammatory changes. Spleen: Normal in size without focal abnormality. Adrenals/Urinary Tract: Adrenal glands are within  normal limits. The right kidney is shrunken with multiple cysts identified. A few small calcifications are seen. No obstructive changes are noted. A left ureteral stent is noted in place. Mild hydronephrosis is noted. Some stones are identified in the region of the right renal pelvis. Air is noted likely related to prior catheter placement although the possibility of pyelonephritis could not be totally excluded. Correlation with the laboratory values is recommended. No definitive ureteral stone is seen. Stomach/Bowel: Diverticular change of the colon is noted without definitive diverticulitis. No perforation or abscess is seen. A large anterior abdominal wall hernia is noted with a neck which measures 5.8 cm in transverse dimension and 3.9 cm in craniocaudad dimension. There are both loops of large and small bowel within. The cecum is also noted within  the hernia as well as an a normal-appearing appendix. No small bowel dilatation to suggest incarceration is noted. Vascular/Lymphatic: Aortic atherosclerosis. No enlarged abdominal or pelvic lymph nodes. Reproductive: Prostate is unremarkable. Other: No abdominal wall hernia or abnormality. No abdominopelvic ascites. Musculoskeletal: Degenerative changes of lumbar spine and hip joints are seen. IMPRESSION: Shrunken right kidney likely with only minimal function. A tiny nonobstructing stone is noted within. Renal cystic changes are seen. Left kidney demonstrates a ureteral stent in place with ureteral pelvic stone identified. Some air is noted within the collecting system likely related to the catheter placement. The possibility of pyelonephritis however could not be totally excluded on the basis of this exam. No prior exams are available for comparison. Correlation with laboratory values is recommended. Large anterior abdominal wall hernia containing both large and small bowel. No obstructive changes are seen. Diverticulosis without diverticulitis. Cholelithiasis  without complicating factors. Electronically Signed   By: Alcide Clever M.D.   On: 11/29/2017 14:48     Medications:   . sodium chloride 75 mL/hr at 11/30/17 0546   . calcium acetate  667 mg Oral TID WC  . cholecalciferol  1,000 Units Oral Daily  . fluticasone  1 spray Each Nare Daily  . heparin  5,000 Units Subcutaneous Q8H  . insulin aspart  0-5 Units Subcutaneous QHS  . insulin aspart  0-9 Units Subcutaneous TID WC  . sodium bicarbonate  650 mg Oral QID   acetaminophen **OR** acetaminophen, ondansetron **OR** ondansetron (ZOFRAN) IV, senna-docusate  Assessment/ Plan:  72 y.o. male with chronic kidney disease stage V, obstructive uropathy status post left urostomy placed in December 2018, hypertension, diabetes type 2, history of Lyme disease, admitted to [facility] on 11/28/2017 for syncope  1.  Chronic kidney disease stage V with obstructive uropathy, left urostomy.  Followed by [facility] nephrology in [place], [place].  Review of care everywhere records show that patient was seen by his primary care physician in February 2019.  At that time  patient's creatinine was 10, BUN 74.  He was recommended to follow-up with his urologist as well as nephrology.  Patient did not go for follow-up. He was evaluated by urologist and nephrostomy tube exchange was recommended.  Patient is scheduled for the procedure later today.  We had a Long discussion with patient regarding need for hemodialysis as uremia might be affecting his mental status.  Risks of not taking dialysis were discussed with patient.  He has been avoiding dialysis and is very circumferential when asked to verbalize if he understood risks of refusing hemodialysis.  He does seem to have previous education as he is knowledgeable about AV fistula and that he does not want to do home treatments.  He has been avoiding getting on dialysis for many months now.  There is suspicion that patient might have underlying dementia.  Medical team is also  concerned whether he has a capacity to make decision regarding hemodialysis because of his recent erratic behaviors. Consider psychiatry consult for evaluation of decision-making capacity for Hemodialysis as well as evaluation of dementia  2.  Obstructive uropathy, stone and left ureteral stone Plan for urostomy as per urologist     LOS: 2 Zissy Hamlett 7/1/201910:57 AM  [facility] [place], [place] [phone]  Note: This note was prepared with Dragon dictation. Any transcription errors are unintentional

## 2017-12-01 ENCOUNTER — Inpatient Hospital Stay: Payer: BLUE CROSS/BLUE SHIELD

## 2017-12-01 LAB — GLUCOSE, CAPILLARY
GLUCOSE-CAPILLARY: 96 mg/dL (ref 70–99)
GLUCOSE-CAPILLARY: 134 mg/dL — AB (ref 70–99)
Glucose-Capillary: 87 mg/dL (ref 70–99)
Glucose-Capillary: 116 mg/dL — ABNORMAL HIGH (ref 70–99)

## 2017-12-01 LAB — BASIC METABOLIC PANEL
ANION GAP: 14 (ref 5–15)
BUN: 71 mg/dL — ABNORMAL HIGH (ref 8–23)
CO2: 20 mmol/L — AB (ref 22–32)
Calcium: 6.8 mg/dL — ABNORMAL LOW (ref 8.9–10.3)
Chloride: 106 mmol/L (ref 98–111)
Creatinine, Ser: 8.97 mg/dL — ABNORMAL HIGH (ref 0.61–1.24)
GFR calc non Af Amer: 5 mL/min — ABNORMAL LOW (ref >60)
GFR, EST AFRICAN AMERICAN: 6 mL/min — AB (ref >60)
GLUCOSE: 97 mg/dL (ref 70–99)
POTASSIUM: 3.4 mmol/L — AB (ref 3.5–5.1)
Sodium: 140 mmol/L (ref 135–145)

## 2017-12-01 MED ORDER — POTASSIUM CHLORIDE CRYS ER 20 MEQ PO TBCR
20.0000 meq | EXTENDED_RELEASE_TABLET | Freq: Two times a day (BID) | ORAL | Status: AC
  Administered 2017-12-01 – 2017-12-02 (×3): 20 meq via ORAL
  Filled 2017-12-01 (×2): qty 1

## 2017-12-01 NOTE — Progress Notes (Signed)
[facility], [place] 12/01/17  Subjective:   Patient states he is feeling better today. He denies any confusion Refused Nephrostomy placement yesterday as it was late in the day.  Serum creatinine and BUN have improved with iv hydration  UOP 1900 cc    Objective:  Vital signs in last 24 hours:  Temp:  [97.8 F (36.6 C)-99.3 F (37.4 C)] 97.8 F (36.6 C) (07/02 0807) Pulse Rate:  [66-73] 68 (07/02 0807) Resp:  [14-17] 14 (07/02 0807) BP: (119-150)/(56-78) 134/78 (07/02 0807) SpO2:  [97 %-100 %] 100 % (07/02 0807) Weight:  [93 kg (205 lb 1.6 oz)] 93 kg (205 lb 1.6 oz) (07/01 1342)  Weight change:  Filed Weights   11/28/17 1659 11/28/17 2016 11/30/17 1342  Weight: 73.5 kg (162 lb) 86.5 kg (190 lb 11.2 oz) 93 kg (205 lb 1.6 oz)    Intake/Output:    Intake/Output Summary (Last 24 hours) at 12/01/2017 0909 Last data filed at 12/01/2017 0726 Gross per 24 hour  Intake 3511.25 ml  Output 1850 ml  Net 1661.25 ml     Physical Exam: General:  No acute distress, sitting in bed, eating breakfast  HEENT  anicteric,    Neck  supple,    Pulm/lungs  lungs are clear to auscultation  CVS/Heart  regular rate and rhythm with ectopic beats  Abdomen:   Soft, large midline hernia  Extremities:  No peripheral edema  Neurologic:  Alert, able to answer questions and follow commands  Skin:  No acute rashes          Basic Metabolic Panel:  Recent Labs  Lab 11/28/17 1707 11/29/17 0502 11/30/17 0519 12/01/17 0602  NA 133* 136 137 140  K 4.0 3.7 3.8 3.4*  CL 96* 101 103 106  CO2 17* 19* 19* 20*  GLUCOSE 153* 105* 99 97  BUN 154* 158* 140* 71*  CREATININE 10.44* 10.33* 9.80* 8.97*  CALCIUM 7.3* 6.7* 6.7* 6.8*  MG 2.4  --   --   --   PHOS  --  9.9*  --   --      CBC: Recent Labs  Lab 11/28/17 1707  WBC 12.6*  NEUTROABS 10.5*  HGB 7.6*  HCT 23.2*  MCV 79.4*  PLT 357     No results found for: HEPBSAG, HEPBSAB,  HEPBIGM    Microbiology:  No results found for this or any previous visit (from the past 240 hour(s)).  Coagulation Studies: No results for input(s): LABPROT, INR in the last 72 hours.  Urinalysis: No results for input(s): COLORURINE, LABSPEC, PHURINE, GLUCOSEU, HGBUR, BILIRUBINUR, KETONESUR, PROTEINUR, UROBILINOGEN, NITRITE, LEUKOCYTESUR in the last 72 hours.  Invalid input(s): APPERANCEUR    Imaging: US Carotid Bilateral  Result Date: 11/29/2017 CLINICAL DATA:  Syncope EXAM: BILATERAL CAROTID DUPLEX ULTRASOUND TECHNIQUE: Wallace Cullens scale imaging, color Doppler and duplex ultrasound was performed of bilateral carotid and vertebral arteries in the neck. COMPARISON:  None. TECHNIQUE: Quantification of carotid stenosis is based on velocity parameters that correlate the residual internal carotid diameter with NASCET-based stenosis levels, using the diameter of the distal internal carotid lumen as the denominator for stenosis measurement. The following velocity measurements were obtained: PEAK SYSTOLIC/END DIASTOLIC RIGHT ICA:                     121/30cm/sec CCA:                     106/10cm/sec SYSTOLIC ICA/CCA RATIO:  1.1 ECA:  101cm/sec LEFT ICA:                     91/32cm/sec CCA:                     86/13cm/sec SYSTOLIC ICA/CCA RATIO:  1.1 ECA:                     92cm/sec FINDINGS: RIGHT CAROTID ARTERY: No significant plaque or stenosis. Normal waveforms and color Doppler signal. RIGHT VERTEBRAL ARTERY:  Normal flow direction and waveform. LEFT CAROTID ARTERY: Mild tortuosity. No significant plaque or stenosis. Normal waveforms and color Doppler signal. LEFT VERTEBRAL ARTERY: Normal flow direction and waveform. IMPRESSION: 1. No significant carotid plaque or stenosis. 2.  Antegrade bilateral vertebral arterial flow. Electronically Signed   By: Corlis Leak M.D.   On: 11/29/2017 12:04   Ct Renal Stone Study  Result Date: 11/29/2017 CLINICAL DATA:  History of recent stent removal  EXAM: CT ABDOMEN AND PELVIS WITHOUT CONTRAST TECHNIQUE: Multidetector CT imaging of the abdomen and pelvis was performed following the standard protocol without IV contrast. COMPARISON:  None. FINDINGS: Lower chest: Changes consistent with prior granulomatous disease are noted. Hepatobiliary: Liver is within normal limits. Gallstones are noted in a decompressed gallbladder. No pericholecystic fluid is noted. Pancreas: Unremarkable. No pancreatic ductal dilatation or surrounding inflammatory changes. Spleen: Normal in size without focal abnormality. Adrenals/Urinary Tract: Adrenal glands are within normal limits. The right kidney is shrunken with multiple cysts identified. A few small calcifications are seen. No obstructive changes are noted. A left ureteral stent is noted in place. Mild hydronephrosis is noted. Some stones are identified in the region of the right renal pelvis. Air is noted likely related to prior catheter placement although the possibility of pyelonephritis could not be totally excluded. Correlation with the laboratory values is recommended. No definitive ureteral stone is seen. Stomach/Bowel: Diverticular change of the colon is noted without definitive diverticulitis. No perforation or abscess is seen. A large anterior abdominal wall hernia is noted with a neck which measures 5.8 cm in transverse dimension and 3.9 cm in craniocaudad dimension. There are both loops of large and small bowel within. The cecum is also noted within the hernia as well as an a normal-appearing appendix. No small bowel dilatation to suggest incarceration is noted. Vascular/Lymphatic: Aortic atherosclerosis. No enlarged abdominal or pelvic lymph nodes. Reproductive: Prostate is unremarkable. Other: No abdominal wall hernia or abnormality. No abdominopelvic ascites. Musculoskeletal: Degenerative changes of lumbar spine and hip joints are seen. IMPRESSION: Shrunken right kidney likely with only minimal function. A tiny  nonobstructing stone is noted within. Renal cystic changes are seen. Left kidney demonstrates a ureteral stent in place with ureteral pelvic stone identified. Some air is noted within the collecting system likely related to the catheter placement. The possibility of pyelonephritis however could not be totally excluded on the basis of this exam. No prior exams are available for comparison. Correlation with laboratory values is recommended. Large anterior abdominal wall hernia containing both large and small bowel. No obstructive changes are seen. Diverticulosis without diverticulitis. Cholelithiasis without complicating factors. Electronically Signed   By: Alcide Clever M.D.   On: 11/29/2017 14:48     Medications:   . sodium chloride 75 mL/hr at 11/30/17 2024   . calcium acetate  667 mg Oral TID WC  . cholecalciferol  1,000 Units Oral Daily  . feeding supplement (NEPRO CARB STEADY)  237 mL Oral BID BM  .  fluticasone  1 spray Each Nare Daily  . heparin  5,000 Units Subcutaneous Q8H  . insulin aspart  0-5 Units Subcutaneous QHS  . insulin aspart  0-9 Units Subcutaneous TID WC  . multivitamin with minerals  1 tablet Oral Daily  . sodium bicarbonate  650 mg Oral QID   acetaminophen **OR** acetaminophen, ondansetron **OR** ondansetron (ZOFRAN) IV, senna-docusate  Assessment/ Plan:  72 y.o. 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  ARF with Chronic kidney disease stage V with obstructive uropathy, left urostomy.  Follow[facility] by [facility]rology in [place], [place].  Review of care everywhere records show that patient was seen by his primary care physician in February 2019.  At that time  patient's creatinine was 6.1, BUN 74.  He was recommended to follow-up with his urologist as well as nephrology.  Patient did not go for  follow-up.  At present, his BUN and Cr have improved with iv hydration. UOP  1900 cc He refused nephrostomy placement yesterday Plan to obtain renal ultrasound to evaluate for hydronephrosis as lab parameters have improved Continue iv fluids  2.  Obstructive uropathy, stone and left ureteral stone Renal Ultrasound  3. 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 Hypokalemia - replace KCl as needed - check Mg tomorrow    LOS: 3 Mc Bloodworth 7/2/20199:09 AM  [facility] [place], [place] [phone]  Note: This note was prepared with Dragon dictation. Any transcription errors are unintentional

## 2017-12-01 NOTE — Plan of Care (Signed)
  Problem: Elimination: Goal: Will not experience complications related to bowel motility Outcome: Progressing   Problem: Safety: Goal: Ability to remain free from injury will improve Outcome: Progressing   Problem: Skin Integrity: Goal: Risk for impaired skin integrity will decrease Outcome: Progressing   

## 2017-12-01 NOTE — Care Management Important Message (Signed)
Important Message  Patient Details  Name: Manuel Walker MRN: [id number] Date of Birth: Oct 26, 1945   Medicare Important Message Given:  Yes    [name] [name] 12/01/2017, 9:48 AM

## 2017-12-01 NOTE — Progress Notes (Addendum)
[employer] -  at [facility]   PATIENT NAME: Manuel Walker    MR#:  [id number]  DATE OF BIRTH:  10-21-1945  SUBJECTIVE:  CHIEF COMPLAINT:   Chief Complaint  Patient presents with  . Loss of Consciousness   The patient has no complaints.  He refused nephrostomy tube placement and dialysis. REVIEW OF SYSTEMS:  Review of Systems  Constitutional: Negative for chills, fever and malaise/fatigue.  HENT: Negative for sore throat.   Eyes: Negative for blurred vision and double vision.  Respiratory: Negative for cough, hemoptysis, shortness of breath, wheezing and stridor.   Cardiovascular: Negative for chest pain, palpitations, orthopnea and leg swelling.  Gastrointestinal: Negative for abdominal pain, blood in stool, diarrhea, melena, nausea and vomiting.  Genitourinary: Negative for dysuria, flank pain and hematuria.  Musculoskeletal: Negative for back pain and joint pain.  Skin: Negative for rash.  Neurological: Negative for dizziness, sensory change, focal weakness, seizures, loss of consciousness, weakness and headaches.  Endo/Heme/Allergies: Negative for polydipsia.  Psychiatric/Behavioral: Negative for depression. The patient is not nervous/anxious.     DRUG ALLERGIES:   Allergies  Allergen Reactions  . Penicillins Swelling    Has patient had a PCN reaction causing immediate rash, facial/tongue/throat swelling, SOB or lightheadedness with hypotension: Unknown Has patient had a PCN reaction causing severe rash involving mucus membranes or skin necrosis: Unknown Has patient had a PCN reaction that required hospitalization: Unknown Has patient had a PCN reaction occurring within the last 10 years: Unknown If all of the above answers are "NO", then may proceed with Cephalosporin use.   VITALS:  Blood pressure 134/78, pulse 68, temperature 97.8 F (36.6 C), temperature source Oral, resp. rate 14, height 6' (1.829 m), weight 205 lb 1.6 oz (93 kg), SpO2  100 %. PHYSICAL EXAMINATION:  Physical Exam  Constitutional: He is oriented to person, place, and time. He appears well-developed.  HENT:  Head: Normocephalic.  Mouth/Throat: Oropharynx is clear and moist.  Eyes: Pupils are equal, round, and reactive to light. Conjunctivae and EOM are normal. No scleral icterus.  Neck: Normal range of motion. Neck supple. No JVD present. No tracheal deviation present.  Cardiovascular: Normal rate, regular rhythm and normal heart sounds. Exam reveals no gallop.  No murmur heard. Pulmonary/Chest: Effort normal and breath sounds normal. No respiratory distress. He has no wheezes. He has no rales.  Abdominal: Soft. Bowel sounds are normal. He exhibits no distension. There is no tenderness. There is no rebound.  Abdominal hernia  Musculoskeletal: Normal range of motion. He exhibits no edema or tenderness.  Neurological: He is alert and oriented to person, place, and time. No cranial nerve deficit.  Skin: No rash noted. No erythema.  Psychiatric:  The patient is AAO x3 but suspected dementia.   LABORATORY PANEL:  Male CBC Recent Labs  Lab 11/28/17 1707  WBC 12.6*  HGB 7.6*  HCT 23.2*  PLT 357   ------------------------------------------------------------------------------------------------------------------ Chemistries  Recent Labs  Lab 11/28/17 1707  12/01/17 0602  NA 133*   < > 140  K 4.0   < > 3.4*  CL 96*   < > 106  CO2 17*   < > 20*  GLUCOSE 153*   < > 97  BUN 154*   < > 71*  CREATININE 10.44*   < > 8.97*  CALCIUM 7.3*   < > 6.8*  MG 2.4  --   --   AST 16  --   --   ALT 20  --   --  ALKPHOS 62  --   --   BILITOT 0.4  --   --    < > = values in this interval not displayed.   RADIOLOGY:  US Renal  Result Date: 12/01/2017 CLINICAL DATA:  Acute renal failure EXAM: RENAL / URINARY TRACT ULTRASOUND COMPLETE COMPARISON:  CT 11/29/2017 FINDINGS: Right Kidney: Length: 9.4 cm. Cortical thinning and increased echotexture. Small scattered  cysts, the largest 2.5 cm in the lower pole. No hydronephrosis. Left Kidney: Length: 10.6 cm. Mildly increased echotexture and cortical thinning. 9 mm echogenic shadowing focus in the region of the renal pelvis may reflect renal pelvic stone or indwelling ureteral stent. Mild caliectasis. Bladder: Appears normal for degree of bladder distention. Ureteral stent noted. IMPRESSION: Cortical thinning and increased echotexture bilaterally, right greater than left compatible chronic medical renal disease. Left ureteral stent in place. Shadowing echogenic area within the left renal pelvis may be the stent or renal pelvic stone. Mild caliectasis on the left. Electronically Signed   By: Charlett Nose M.D.   On: 12/01/2017 10:27   ASSESSMENT AND PLAN:   72 year old male patient with history of chronic kidney disease stage V with a nephrostomy tube not on dialysis, diabetes mellitus type 2 was found wandering in the car in [place].  He also had passed out in the gas station before being picked up by EMS.  -Altered mental status Acute metabolic encephalopathy Improved.   -Syncope, possible due to worsening renal function and orthostatic hypotension. Echocardiogram is normal, carotid ultrasound: Unremarkable. On IV fluids, hold Lopressor.  Hypertension.  Hold beta-blocker due to low side blood pressure.  Elevated troponin.  Possible due to worsening renal function.  Stable.  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  Continue sodium bicarbonate. Per Dr. Thedore Mins, concerned whether he has a capacity to make decision regarding hemodialysis because of his recent erratic behaviors. Consider psychiatry consult for evaluation of decision-making capacity for Hemodialysis as well as evaluation of dementia. Per Dr. Toni Amend, probably have a mild degree of dementia but also be perhaps subject to intermittent episodes of delirium related to his  kidney disease or dehydration or some other unknown factor. It is not safe for the patient to leave hospital and drive himself.  I discussed with the patient about discharge plan and he said his wife will not come to pick up him because his wife cannot drive.  Renal function is improving with IV fluid support and patient has adequate urine output.  Follow-up BMP.    Obstructive uropathy, stone and left ureteral stone The patient refused nephrostomy placement.  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  -Abdominal hernia Supportive care  I called his wife Ms. Marcell Anger again, but nobody answered the phone. All the records are reviewed and case discussed with Care Management/Social Worker. Management plans discussed with the patient, family and they are in agreement.  CODE STATUS: Full Code  TOTAL TIME TAKING CARE OF THIS PATIENT: 35 minutes.   More than 50% of the time was spent in counseling/coordination of care: YES  POSSIBLE D/C IN 2 DAYS, DEPENDING ON CLINICAL CONDITION.   Shaune Pollack M.D on 12/01/2017 at 4:08 PM  Between 7am to 6pm - Pager - [phone]  After 6pm go to [email or web address] - Therapist, nutritional Hospitalists

## 2017-12-01 NOTE — Consult Note (Signed)
[facility] Consult   Reason for Consult: Follow-up consult 72 year old man brought into the hospital after being found confused at a gas station Referring Physician: Bridgett Larsson Patient Identification: Manuel Walker MRN:  [id number] Principal Diagnosis: Mild dementia Diagnosis:   Patient Active Problem List   Diagnosis Date Noted  . 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 Syncope [R55] 11/28/2017    Total Time spent with patient: 30 minutes  Subjective:   Manuel Walker is a 72 y.o. male patient admitted with "I feel better".  HPI: Patient seen chart reviewed.  His renal numbers have gotten better with rehydration and he is probably nearly at his baseline medical state.  Patient, who had been stating that he was going to leave last night, is still in the hospital.  He tells me that his friend "Ronalee Belts" from [place] did in fact come to visit him and is going to help him to get to his car so that he, the patient, can drive back to [place].  Patient was not obviously psychotic and denied any mood symptoms but his insight is still poor.  He does not except the obvious fact that it is dangerous for him to drive back to [place].  He tends to repeat himself frequently making stereotyped clich and phrases like "the car almost drives itself".  Patient really would not acknowledge the reality of what happened to him or that there was any risk in his driving.  Past Psychiatric History: As noted previously there evidently has been an awareness on the part of his family that he has been having more cognitive problems.  They had tried to take his car keys away from him but evidently he found a way to sneak out.  Risk to Self:   Risk to Others:   Prior Inpatient Therapy:   Prior Outpatient Therapy:    Past Medical History:  Past Medical History:  Diagnosis Date  . Diabetes mellitus without complication ([place])   . Renal disorder     Past Surgical History:  Procedure Laterality Date  .  nephostomy     Family History: History reviewed. No pertinent family history. Family Psychiatric  History: None known Social History:  Social History   Substance and Sexual Activity  Alcohol Use Not Currently     Social History   Substance and Sexual Activity  Drug Use Not Currently    Social History   Socioeconomic History  . Marital status: Married    Spouse name: Not on file  . Number of children: Not on file  . Years of education: Not on file  . Highest education level: Not on file  Occupational History  . Not on file  Social Needs  . Financial resource strain: Not on file  . Food insecurity:    Worry: Not on file    Inability: Not on file  . Transportation needs:    Medical: Not on file    Non-medical: Not on file  Tobacco Use  . Smoking status: Never Smoker  . Smokeless tobacco: Never Used  Substance and Sexual Activity  . Alcohol use: Not Currently  . Drug use: Not Currently  . Sexual activity: Not on file  Lifestyle  . Physical activity:    Days per week: Not on file    Minutes per session: Not on file  . Stress: Not on file  Relationships  . Social connections:    Talks on phone: Not on file    Gets together: Not on file  Attends religious service: Not on file    Active member of club or organization: Not on file    Attends meetings of clubs or organizations: Not on file    Relationship status: Not on file  Other Topics Concern  . Not on file  Social History Narrative  . Not on file   Additional Social History:    Allergies:   Allergies  Allergen Reactions  . Penicillins Swelling    Has patient had a PCN reaction causing immediate rash, facial/tongue/throat swelling, SOB or lightheadedness with hypotension: Unknown Has patient had a PCN reaction causing severe rash involving mucus membranes or skin necrosis: Unknown Has patient had a PCN reaction that required hospitalization: Unknown Has patient had a PCN reaction occurring within the  last 10 years: Unknown If all of the above answers are "NO", then may proceed with Cephalosporin use.    Labs:  Results for orders placed or performed during the hospital encounter of 11/28/17 (from the past 48 hour(s))  Glucose, capillary     Status: Abnormal   Collection Time: 11/29/17 10:01 PM  Result Value Ref Range   Glucose-Capillary 108 (H) 70 - 99 mg/dL   Comment 1 Notify RN    Comment 2 Document in Chart   Hemoglobin A1c     Status: Abnormal   Collection Time: 11/30/17  5:19 AM  Result Value Ref Range   Hgb A1c MFr Bld 6.0 (H) 4.8 - 5.6 %    Comment: (NOTE) Pre diabetes:          5.7%-6.4% Diabetes:              >6.4% Glycemic control for   <7.0% adults with diabetes    Mean Plasma Glucose 125.5 mg/dL    Comment: Performed at [facility] [address], [place], [place] [address]  Basic metabolic panel     Status: Abnormal   Collection Time: 11/30/17  5:19 AM  Result Value Ref Range   Sodium 137 135 - 145 mmol/L   Potassium 3.8 3.5 - 5.1 mmol/L   Chloride 103 98 - 111 mmol/L    Comment: Please note change in reference range.   CO2 19 (L) 22 - 32 mmol/L   Glucose, Bld 99 70 - 99 mg/dL    Comment: Please note change in reference range.   BUN 140 (H) 8 - 23 mg/dL    Comment: RESULT CONFIRMED BY MANUAL DILUTION...[facility] Please note change in reference range.    Creatinine, Ser 9.80 (H) 0.61 - 1.24 mg/dL   Calcium 6.7 (L) 8.9 - 10.3 mg/dL   GFR calc non Af Amer 5 (L) >60 mL/min   GFR calc Af Amer 5 (L) >60 mL/min    Comment: (NOTE) The eGFR has been calculated using the CKD EPI equation. This calculation has not been validated in all clinical situations. eGFR's persistently <60 mL/min signify possible Chronic Kidney Disease.    Anion gap 15 5 - 15    Comment: Performed at [facility], [address] [place], [place] [address]  Glucose, capillary     Status: Abnormal   Collection Time: 11/30/17  7:56 AM  Result Value Ref Range    Glucose-Capillary 102 (H) 70 - 99 mg/dL  Glucose, capillary     Status: None   Collection Time: 11/30/17 11:39 AM  Result Value Ref Range   Glucose-Capillary 80 70 - 99 mg/dL  Glucose, capillary     Status: Abnormal   Collection Time: 11/30/17  4:53 PM  Result Value Ref Range   Glucose-Capillary 128 (H) 70 - 99 mg/dL  Glucose, capillary     Status: Abnormal   Collection Time: 11/30/17  8:41 PM  Result Value Ref Range   Glucose-Capillary 110 (H) 70 - 99 mg/dL  Basic metabolic panel     Status: Abnormal   Collection Time: 12/01/17  6:02 AM  Result Value Ref Range   Sodium 140 135 - 145 mmol/L   Potassium 3.4 (L) 3.5 - 5.1 mmol/L   Chloride 106 98 - 111 mmol/L    Comment: Please note change in reference range.   CO2 20 (L) 22 - 32 mmol/L   Glucose, Bld 97 70 - 99 mg/dL    Comment: Please note change in reference range.   BUN 71 (H) 8 - 23 mg/dL    Comment: RESULT CONFIRMED BY MANUAL DILUTION/[facility] Please note change in reference range.    Creatinine, Ser 8.97 (H) 0.61 - 1.24 mg/dL   Calcium 6.8 (L) 8.9 - 10.3 mg/dL   GFR calc non Af Amer 5 (L) >60 mL/min   GFR calc Af Amer 6 (L) >60 mL/min    Comment: (NOTE) The eGFR has been calculated using the CKD EPI equation. This calculation has not been validated in all clinical situations. eGFR's persistently <60 mL/min signify possible Chronic Kidney Disease.    Anion gap 14 5 - 15    Comment: Performed at [facility], [address] [place], [place] [address]  Glucose, capillary     Status: None   Collection Time: 12/01/17  8:06 AM  Result Value Ref Range   Glucose-Capillary 96 70 - 99 mg/dL  Glucose, capillary     Status: None   Collection Time: 12/01/17 11:56 AM  Result Value Ref Range   Glucose-Capillary 87 70 - 99 mg/dL  Glucose, capillary     Status: Abnormal   Collection Time: 12/01/17  5:06 PM  Result Value Ref Range   Glucose-Capillary 116 (H) 70 - 99 mg/dL    Current Facility-Administered Medications   Medication Dose Route Frequency Provider Last Rate Last Dose  . 0.9 %  sodium chloride infusion   Intravenous Continuous Saundra Shelling, MD 75 mL/hr at 12/01/17 0941    . acetaminophen (TYLENOL) tablet 650 mg  650 mg Oral Q6H PRN Saundra Shelling, MD       Or  . acetaminophen (TYLENOL) suppository 650 mg  650 mg Rectal Q6H PRN Pyreddy, Pavan, MD      . calcium acetate (PHOSLO) capsule 667 mg  667 mg Oral TID WC Demetrios Loll, MD   667 mg at 12/01/17 1256  . cholecalciferol (VITAMIN D) tablet 1,000 Units  1,000 Units Oral Daily Demetrios Loll, MD   1,000 Units at 12/01/17 1035  . feeding supplement (NEPRO CARB STEADY) liquid 237 mL  237 mL Oral BID BM Demetrios Loll, MD   237 mL at 12/01/17 1304  . fluticasone (FLONASE) 50 MCG/ACT nasal spray 1 spray  1 spray Each Nare Daily Demetrios Loll, MD   1 spray at 12/01/17 1035  . heparin injection 5,000 Units  5,000 Units Subcutaneous Q8H Saundra Shelling, MD   5,000 Units at 11/30/17 0546  . insulin aspart (novoLOG) injection 0-5 Units  0-5 Units Subcutaneous QHS Demetrios Loll, MD      . insulin aspart (novoLOG) injection 0-9 Units  0-9 Units Subcutaneous TID WC Demetrios Loll, MD   1 Units at 11/30/17 1729  . multivitamin with minerals tablet 1  tablet  1 tablet Oral Daily Demetrios Loll, MD      . ondansetron [facility]) tablet 4 mg  4 mg Oral Q6H PRN Saundra Shelling, MD       Or  . ondansetron (ZOFRAN) injection 4 mg  4 mg Intravenous Q6H PRN Pyreddy, Pavan, MD      . potassium chloride SA (K-DUR,KLOR-CON) CR tablet 20 mEq  20 mEq Oral BID Murlean Iba, MD   20 mEq at 12/01/17 1035  . senna-docusate (Senokot-S) tablet 1 tablet  1 tablet Oral QHS PRN Pyreddy, Reatha Harps, MD      . sodium bicarbonate tablet 650 mg  650 mg Oral QID Demetrios Loll, MD   650 mg at 12/01/17 1734    Musculoskeletal: Strength & Muscle Tone: decreased Gait & Station: unsteady Patient leans: N/A  Psychiatric Specialty Exam: Physical Exam  Nursing note and vitals reviewed. Constitutional: He appears  well-developed and well-nourished.  HENT:  Head: Normocephalic and atraumatic.  Eyes: Pupils are equal, round, and reactive to light. Conjunctivae are normal.  Neck: Normal range of motion.  Cardiovascular: Normal heart sounds.  Respiratory: Effort normal.  GI: Soft.  Musculoskeletal: Normal range of motion.  Neurological: He is alert.  Skin: Skin is warm and dry.  Psychiatric: His affect is blunt. His speech is tangential. He is not agitated and not aggressive. Thought content is not paranoid. Cognition and memory are impaired. He expresses impulsivity and inappropriate judgment. He expresses no homicidal and no suicidal ideation. He exhibits abnormal recent memory and abnormal remote memory. He is inattentive.    Review of Systems  Constitutional: Negative.   HENT: Negative.   Eyes: Negative.   Respiratory: Negative.   Cardiovascular: Negative.   Gastrointestinal: Negative.   Musculoskeletal: Negative.   Skin: Negative.   Neurological: Positive for dizziness.  Psychiatric/Behavioral: Negative.     Blood pressure 130/76, pulse 75, temperature 97.8 F (36.6 C), temperature source Oral, resp. rate 18, height 6' (1.829 m), weight 93 kg (205 lb 1.6 oz), SpO2 100 %.Body mass index is 27.82 kg/m.  General Appearance: Casual  Eye Contact:  Fair  Speech:  Slow  Volume:  Decreased  Mood:  Euthymic  Affect:  Constricted  Thought Process:  Disorganized  Orientation:  Full (Time, Place, and Person)  Thought Content:  Illogical  Suicidal Thoughts:  No  Homicidal Thoughts:  No  Memory:  Immediate;   Fair Recent;   Poor Remote;   Poor  Judgement:  Impaired  Insight:  Shallow  Psychomotor Activity:  Decreased  Concentration:  Concentration: Poor  Recall:  Poor  Fund of Knowledge:  Fair  Language:  Fair  Akathisia:  No  Handed:  Right  AIMS (if indicated):     Assets:  Housing  ADL's:  Impaired  Cognition:  Impaired,  Mild  Sleep:        Treatment Plan Summary: Plan  72 year old man who continues to show poor insight into his cognitive problems and into the high risk that he would have with driving back by himself to [place].  I am disappointed that family have not shown up to escort him back home.  Patient dismisses the idea of this out of hand.  I really am concerned that his judgment about this is poor enough that it is just not right for him to drive home by himself.  On the other hand there is really no specific treatment I think for this and I do not think we could justify admitting him to  the psychiatric ward.  I feel inappropriate filing commitment papers on him.  Strongly recommend that some kind of arrangement be made to see if he can be taken back home without doing it all by himself.  I realize this may be difficult especially if family is not cooperative.  But there is no indication for any psychiatric medicine really at this point.  I tried to explain to the patient and at least get him to acknowledge the situation but frustratingly with little success.  Disposition: Supportive therapy provided about ongoing stressors.  Alethia Berthold, MD 12/01/2017 5:58 PM

## 2017-12-01 NOTE — Plan of Care (Signed)
  Problem: Clinical Measurements: Goal: Diagnostic test results will improve Outcome: Not Progressing  Pt refusing medication and procedures

## 2017-12-02 LAB — URINALYSIS, COMPLETE (UACMP) WITH MICROSCOPIC
Bilirubin Urine: NEGATIVE
Glucose, UA: NEGATIVE mg/dL
Ketones, ur: NEGATIVE mg/dL
Nitrite: NEGATIVE
PH: 7 (ref 5.0–8.0)
Protein, ur: 100 mg/dL — AB
SPECIFIC GRAVITY, URINE: 1.006 (ref 1.005–1.030)
Squamous Epithelial / LPF: NONE SEEN (ref 0–5)
WBC, UA: >50 WBC/hpf — ABNORMAL HIGH (ref 0–5)

## 2017-12-02 LAB — BASIC METABOLIC PANEL
Anion gap: 11 (ref 5–15)
BUN: 119 mg/dL — ABNORMAL HIGH (ref 8–23)
CALCIUM: 6.8 mg/dL — AB (ref 8.9–10.3)
CHLORIDE: 111 mmol/L (ref 98–111)
CO2: 20 mmol/L — ABNORMAL LOW (ref 22–32)
Creatinine, Ser: 8.37 mg/dL — ABNORMAL HIGH (ref 0.61–1.24)
GFR, EST AFRICAN AMERICAN: 6 mL/min — AB (ref >60)
GFR, EST NON AFRICAN AMERICAN: 6 mL/min — AB (ref >60)
Glucose, Bld: 94 mg/dL (ref 70–99)
Potassium: 3.5 mmol/L (ref 3.5–5.1)
SODIUM: 142 mmol/L (ref 135–145)

## 2017-12-02 LAB — CBC
HCT: 18.8 % — ABNORMAL LOW (ref 40.0–52.0)
HEMOGLOBIN: 6.2 g/dL — AB (ref 13.0–18.0)
MCH: 26.4 pg (ref 26.0–34.0)
MCHC: 32.8 g/dL (ref 32.0–36.0)
MCV: 80.7 fL (ref 80.0–100.0)
Platelets: 277 10*3/uL (ref 150–440)
RBC: 2.33 MIL/uL — ABNORMAL LOW (ref 4.40–5.90)
RDW: 17.8 % — AB (ref 11.5–14.5)
WBC: 7.6 10*3/uL (ref 3.8–10.6)

## 2017-12-02 LAB — ABO/RH: ABO/RH(D): B NEG

## 2017-12-02 LAB — MAGNESIUM: Magnesium: 2.1 mg/dL (ref 1.7–2.4)

## 2017-12-02 LAB — GLUCOSE, CAPILLARY
GLUCOSE-CAPILLARY: 107 mg/dL — AB (ref 70–99)
Glucose-Capillary: 83 mg/dL (ref 70–99)
Glucose-Capillary: 113 mg/dL — ABNORMAL HIGH (ref 70–99)
Glucose-Capillary: 105 mg/dL — ABNORMAL HIGH (ref 70–99)

## 2017-12-02 LAB — PHOSPHORUS: Phosphorus: 6.2 mg/dL — ABNORMAL HIGH (ref 2.5–4.6)

## 2017-12-02 MED ORDER — ACETAMINOPHEN 325 MG PO TABS
650.0000 mg | ORAL_TABLET | Freq: Once | ORAL | Status: AC
  Administered 2017-12-02: 650 mg via ORAL
  Filled 2017-12-02: qty 2

## 2017-12-02 MED ORDER — SODIUM CHLORIDE 0.9% IV SOLUTION
Freq: Once | INTRAVENOUS | Status: AC
  Administered 2017-12-02: 08:00:00 via INTRAVENOUS

## 2017-12-02 NOTE — Progress Notes (Signed)
Dr. Luberta MutterKonidena notified of patient's pink color urine. UA ordered.

## 2017-12-02 NOTE — Progress Notes (Signed)
Dr. Imogene Burnhen notified of patients urine being a dark/tea colored, which is something new for pt. No new orders received. Will continue to monitor.

## 2017-12-02 NOTE — Progress Notes (Signed)
[employer] - [place] at [facility]   PATIENT NAME: Manuel Walker    MR#:  [id number]  DATE OF BIRTH:  Aug 14, 1945  SUBJECTIVE:  CHIEF COMPLAINT:   Chief Complaint  Patient presents with  . Loss of Consciousness   The patient has no complaints.  He refused nephrostomy tube placement and dialysis.  Hemoglobin decreased to 6.2. REVIEW OF SYSTEMS:  Review of Systems  Constitutional: Negative for chills, fever and malaise/fatigue.  HENT: Negative for sore throat.   Eyes: Negative for blurred vision and double vision.  Respiratory: Negative for cough, hemoptysis, shortness of breath, wheezing and stridor.   Cardiovascular: Negative for chest pain, palpitations, orthopnea and leg swelling.  Gastrointestinal: Negative for abdominal pain, blood in stool, diarrhea, melena, nausea and vomiting.  Genitourinary: Negative for dysuria, flank pain and hematuria.  Musculoskeletal: Negative for back pain and joint pain.  Skin: Negative for rash.  Neurological: Negative for dizziness, sensory change, focal weakness, seizures, loss of consciousness, weakness and headaches.  Endo/Heme/Allergies: Negative for polydipsia.  Psychiatric/Behavioral: Negative for depression. The patient is not nervous/anxious.     DRUG ALLERGIES:   Allergies  Allergen Reactions  . Penicillins Swelling    Has patient had a PCN reaction causing immediate rash, facial/tongue/throat swelling, SOB or lightheadedness with hypotension: Unknown Has patient had a PCN reaction causing severe rash involving mucus membranes or skin necrosis: Unknown Has patient had a PCN reaction that required hospitalization: Unknown Has patient had a PCN reaction occurring within the last 10 years: Unknown If all of the above answers are "NO", then may proceed with Cephalosporin use.   VITALS:  Blood pressure 127/83, pulse 70, temperature 98.4 F (36.9 C), temperature source Oral, resp. rate 18, height 6' (1.829 m), weight  196 lb 8 oz (89.1 kg), SpO2 98 %. PHYSICAL EXAMINATION:  Physical Exam  Constitutional: He is oriented to person, place, and time. He appears well-developed.  HENT:  Head: Normocephalic.  Mouth/Throat: Oropharynx is clear and moist.  Eyes: Pupils are equal, round, and reactive to light. Conjunctivae and EOM are normal. No scleral icterus.  Neck: Normal range of motion. Neck supple. No JVD present. No tracheal deviation present.  Cardiovascular: Normal rate, regular rhythm and normal heart sounds. Exam reveals no gallop.  No murmur heard. Pulmonary/Chest: Effort normal and breath sounds normal. No respiratory distress. He has no wheezes. He has no rales.  Abdominal: Soft. Bowel sounds are normal. He exhibits no distension. There is no tenderness. There is no rebound.  Abdominal hernia  Musculoskeletal: Normal range of motion. He exhibits no edema or tenderness.  Neurological: He is alert and oriented to person, place, and time. No cranial nerve deficit.  Skin: No rash noted. No erythema.   LABORATORY PANEL:  Male CBC Recent Labs  Lab 12/02/17 0421  WBC 7.6  HGB 6.2*  HCT 18.8*  PLT 277   ------------------------------------------------------------------------------------------------------------------ Chemistries  Recent Labs  Lab 11/28/17 1707  12/02/17 0421 12/02/17 0703  NA 133*   < > 142  --   K 4.0   < > 3.5  --   CL 96*   < > 111  --   CO2 17*   < > 20*  --   GLUCOSE 153*   < > 94  --   BUN 154*   < > 119*  --   CREATININE 10.44*   < > 8.37*  --   CALCIUM 7.3*   < > 6.8*  --   MG 2.4  --   --  2.1  AST 16  --   --   --   ALT 20  --   --   --   ALKPHOS 62  --   --   --   BILITOT 0.4  --   --   --    < > = values in this interval not displayed.   RADIOLOGY:  No results found. ASSESSMENT AND PLAN:   72 year old male patient with history of chronic kidney disease stage V with a nephrostomy tube not on dialysis, diabetes mellitus type 2 was found wandering in the  car in [place].  He also had passed out in the gas station before being picked up by EMS.  -Altered mental status Acute metabolic encephalopathy Improved.   -Syncope, possible due to worsening renal function and orthostatic hypotension. Echocardiogram is normal, carotid ultrasound: Unremarkable. On IV fluids, hold Lopressor.  Hypertension.  Hold beta-blocker due to low side blood pressure.  Elevated troponin.  Possible due to worsening renal function.  Stable.  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  Continue sodium bicarbonate. Per Dr. Thedore MinsSingh, concerned whether he has a capacity to make decision regarding hemodialysis because of his recent erratic behaviors. Consider psychiatry consult for evaluation of decision-making capacity for Hemodialysis as well as evaluation of dementia. Per Dr. Toni Amendlapacs, probably have a mild degree of dementia but also be perhaps subject to intermittent episodes of delirium related to his kidney disease or dehydration or some other unknown factor. It is not safe for the patient to leave hospital and drive himself.    Renal function is improving with IV fluid support and patient has adequate urine output.  Follow-up BMP.  No need for hemodialysis this time per Dr. Thedore MinsSingh.    Obstructive uropathy, stone and left ureteral stone The patient refused nephrostomy placement.  -Anemia of chronic disease secondary to renal failure.  Hemoglobin decreased to 6.2. PRBC 1 unit transfusion today and follow-up hemoglobin in a.m.  -Abdominal hernia Supportive care  I called his wife Ms. [name], updated the patient current condition and discharge plan.  She said that she will come to the hospital tomorrow. All the records are reviewed and case discussed with Care Management/Social Worker. Management plans discussed with the patient, his wife and they are in agreement.  CODE STATUS: Full  Code  TOTAL TIME TAKING CARE OF THIS PATIENT: 42 minutes.   More than 50% of the time was spent in counseling/coordination of care: YES  POSSIBLE D/C IN 1-2 DAYS, DEPENDING ON CLINICAL CONDITION.   Shaune PollackQing Delno Blaisdell M.D on 12/02/2017 at 1:56 PM  Between 7am to 6pm - Pager - [phone]  After 6pm go to [email or web address] - Therapist, nutritionalpassword [email or web address]  [facility]

## 2017-12-02 NOTE — Progress Notes (Signed)
Nutrition Follow Up Note   DOCUMENTATION CODES:   Not applicable  INTERVENTION:   Nepro Shake po BID, each supplement provides 425 kcal and 19 grams protein  MVI daily  NUTRITION DIAGNOSIS:   Inadequate oral intake related to acute illness as evidenced by NPO status.  -pt advanced to heart healthy/carb modified diet   GOAL:   Patient will meet greater than or equal to 90% of their needs  -progressing   MONITOR:   PO intake, Supplement acceptance, Labs, Weight trends, Skin, I & O's  ASSESSMENT:   72 y.o. male with chronic kidney disease stage V, obstructive uropathy status post left urostomy placed in December 2018, hypertension, diabetes type 2, history of Lyme disease, admitted [facility] on 11/28/2017 for syncope   Pt doing well; ate 100% of breakfast this morning. Pt drinking some Nepro, prefers Ensure but pt with hyperphosphatemia. Per chart, pt's weights is stable.   Medications reviewed and include: phoslo, vitamin D, heparin, insulin, MVI, KCl, Na Bicarbonate, NaCl @75ml /hr  Labs reviewed: BUN 119(H), creat 8.37(H), K 3.5 wnl, P 6.2(H), Mg 2.1 wnl Hgb 6.2(L), Hct 18.8(L) cbgs- 96, 87, 116, 134, 83 x 24 hrs  NUTRITION - FOCUSED PHYSICAL EXAM:    Most Recent Value  Orbital Region  No depletion  Upper Arm Region  Mild depletion  Thoracic and Lumbar Region  Mild depletion  Buccal Region  No depletion  Temple Region  Mild depletion  Clavicle Bone Region  Mild depletion  Clavicle and Acromion Bone Region  Mild depletion  Scapular Bone Region  No depletion  Dorsal Hand  No depletion  Patellar Region  Moderate depletion  Anterior Thigh Region  Mild depletion  Posterior Calf Region  Mild depletion  Edema (RD Assessment)  None  Hair  Reviewed  Eyes  Reviewed  Mouth  Reviewed  Skin  Reviewed  Nails  Reviewed      Diet Order:   Diet Order           Diet heart healthy/carb modified Room service appropriate? Yes; Fluid consistency: Thin  Diet effective now          EDUCATION NEEDS:   Education needs have been addressed  Skin:  Skin Assessment: Reviewed RN Assessment  Last BM:  6/30- type 4  Height:   Ht Readings from Last 1 Encounters:  11/28/17 6' (1.829 m)    Weight:   Wt Readings from Last 1 Encounters:  12/02/17 196 lb 8 oz (89.1 kg)    Ideal Body Weight:  80.9 kg  BMI:  Body mass index is 26.65 kg/m.  Estimated Nutritional Needs:   Kcal:  2100-2400kcal/day   Protein:  93-112g/day   Fluid:  >2.1L/day   Betsey Holidayasey Nakeda Lebron MS, RD, LDN Pager #- [phone] Office#- [phone] After Hours Pager: [phone]

## 2017-12-02 NOTE — Progress Notes (Signed)
Manuel Walker, [place] 12/02/17  Subjective:   Patient states he is feeling better today. He denies any confusion Appetite is good.  No nausea or vomiting reported Urine output 1450 cc Hemoglobin is noted to be low at 6.2.  Patient denies any frank blood in the stool or urine Serum creatinine has further improved to 8.37 BUN remains critically high at 119 Patient is scheduled to get blood transfusion today    Objective:  Vital signs in last 24 hours:  Temp:  [97.8 F (36.6 C)-98.9 F (37.2 C)] 97.8 F (36.6 C) (07/03 0741) Pulse Rate:  [68-80] 80 (07/03 0741) Resp:  [16-18] 18 (07/03 0741) BP: (110-156)/(53-82) 126/75 (07/03 0741) SpO2:  [97 %-100 %] 97 % (07/03 0741) Weight:  [89.1 kg (196 lb 8 oz)-89.7 kg (197 lb 11.2 oz)] 89.1 kg (196 lb 8 oz) (07/03 0529)  Weight change: -3.357 kg (-7 lb 6.4 oz) Filed Weights   11/30/17 1342 12/01/17 1900 12/02/17 0529  Weight: 93 kg (205 lb 1.6 oz) 89.7 kg (197 lb 11.2 oz) 89.1 kg (196 lb 8 oz)    Intake/Output:    Intake/Output Summary (Last 24 hours) at 12/02/2017 4098 Last data filed at 12/02/2017 0700 Gross per 24 hour  Intake 700 ml  Output 1200 ml  Net -500 ml     Physical Exam: General:  No acute distress, sitting in bed, eating breakfast  HEENT  anicteric,    Neck  supple,    Pulm/lungs  lungs are clear to auscultation  CVS/Heart  regular rate and rhythm with ectopic beats  Abdomen:   Soft, large midline hernia  Extremities:  No peripheral edema  Neurologic:  Alert, able to answer questions and follow commands  Skin:  No acute rashes          Basic Metabolic Panel:  Recent Labs  Lab 11/28/17 1707 11/29/17 0502 11/30/17 0519 12/01/17 0602 12/02/17 0421  NA 133* 136 137 140 142  K 4.0 3.7 3.8 3.4* 3.5  CL 96* 101 103 106 111  CO2 17* 19* 19* 20* 20*  GLUCOSE 153* 105* 99 97 94  BUN 154* 158* 140* 71* 119*  CREATININE 10.44* 10.33* 9.80* 8.97* 8.37*  CALCIUM 7.3* 6.7* 6.7* 6.8*  6.8*  MG 2.4  --   --   --   --   PHOS  --  9.9*  --   --   --      CBC: Recent Labs  Lab 11/28/17 1707 12/02/17 0421  WBC 12.6* 7.6  NEUTROABS 10.5*  --   HGB 7.6* 6.2*  HCT 23.2* 18.8*  MCV 79.4* 80.7  PLT 357 277     No results found for: HEPBSAG, HEPBSAB, HEPBIGM    Microbiology:  No results found for this or any previous visit (from the past 240 hour(s)).  Coagulation Studies: No results for input(s): LABPROT, INR in the last 72 hours.  Urinalysis: No results for input(s): COLORURINE, LABSPEC, PHURINE, GLUCOSEU, HGBUR, BILIRUBINUR, KETONESUR, PROTEINUR, UROBILINOGEN, NITRITE, LEUKOCYTESUR in the last 72 hours.  Invalid input(s): APPERANCEUR    Imaging: US Renal  Result Date: 12/01/2017 CLINICAL DATA:  Acute renal failure EXAM: RENAL / URINARY TRACT ULTRASOUND COMPLETE COMPARISON:  CT 11/29/2017 FINDINGS: Right Kidney: Length: 9.4 cm. Cortical thinning and increased echotexture. Small scattered cysts, the largest 2.5 cm in the lower pole. No hydronephrosis. Left Kidney: Length: 10.6 cm. Mildly increased echotexture and cortical thinning. 9 mm echogenic shadowing focus in the region of the renal pelvis  may reflect renal pelvic stone or indwelling ureteral stent. Mild caliectasis. Bladder: Appears normal for degree of bladder distention. Ureteral stent noted. IMPRESSION: Cortical thinning and increased echotexture bilaterally, right greater than left compatible chronic medical renal disease. Left ureteral stent in place. Shadowing echogenic area within the left renal pelvis may be the stent or renal pelvic stone. Mild caliectasis on the left. Electronically Signed   By: Charlett NoseKevin  Dover M.D.   On: 12/01/2017 10:27     Medications:   . sodium chloride 75 mL/hr at 12/01/17 2047   . calcium acetate  667 mg Oral TID WC  . cholecalciferol  1,000 Units Oral Daily  . feeding supplement (NEPRO CARB STEADY)  237 mL Oral BID BM  . fluticasone  1 spray Each Nare Daily  . heparin   5,000 Units Subcutaneous Q8H  . insulin aspart  0-5 Units Subcutaneous QHS  . insulin aspart  0-9 Units Subcutaneous TID WC  . multivitamin with minerals  1 tablet Oral Daily  . potassium chloride  20 mEq Oral BID  . sodium bicarbonate  650 mg Oral QID   acetaminophen **OR** acetaminophen, ondansetron **OR** ondansetron (ZOFRAN) IV, senna-docusate  Assessment/ Plan:  72 y.o. male with chronic kidney disease stage V, obstructive uropathy status post left urostomy placed in December 2018, hypertension, diabetes type 2, history of Lyme disease, admitted to [facility] on 11/28/2017 for syncope  1.  ARF with Chronic kidney disease stage V with obstructive uropathy, left urostomy.  [facility] in [place].  Review of care everywhere records show that patient was seen by his primary care physician in February 2019.  At that time  patient's creatinine was 6.1, BUN 74.  He was recommended to follow-up with his urologist as well as nephrology.  Patient did not go for follow-up.  At present, his BUN and Cr have improved with iv hydration. UOP  1400 cc He refused nephrostomy placement as well as initiating dialysis this admission Renal ultrasound negative for left hydronephrosis which implies that there is no acute obstruction and urine may be able to pass into the bladder.  Electrolytes and volume status are acceptable.  Mental status is somewhat difficult to assess as far as uremia is concerned.  Psychiatry evaluation is in progress.  There is no acute indication for dialysis at present however, he will need it in the near future.  We have offered to get him started on chronic dialysis while he is here but he has refused.   2.  Obstructive uropathy, stone and left ureteral stone Renal Ultrasound negative for hydronephrosis  3. 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 Hypokalemia - replace KCl as needed  5.  Anemia, likely of  chronic kidney disease with acute worsening blood transfusion Offered to give EPO injection but patient wants to defer for now   LOS: 4 Lyrik Dockstader [facility] 7/3/20199:03 AM  [facility] [place], [place] [phone]  Note: This note was prepared with Dragon dictation. Any transcription errors are unintentional

## 2017-12-03 LAB — BASIC METABOLIC PANEL
Anion gap: 11 (ref 5–15)
BUN: 112 mg/dL — AB (ref 8–23)
CO2: 20 mmol/L — AB (ref 22–32)
CREATININE: 7.67 mg/dL — AB (ref 0.61–1.24)
Calcium: 6.9 mg/dL — ABNORMAL LOW (ref 8.9–10.3)
Chloride: 110 mmol/L (ref 98–111)
GFR calc non Af Amer: 6 mL/min — ABNORMAL LOW (ref >60)
GFR, EST AFRICAN AMERICAN: 7 mL/min — AB (ref >60)
Glucose, Bld: 143 mg/dL — ABNORMAL HIGH (ref 70–99)
Potassium: 4 mmol/L (ref 3.5–5.1)
Sodium: 141 mmol/L (ref 135–145)

## 2017-12-03 LAB — CBC
HCT: 22.4 % — ABNORMAL LOW (ref 40.0–52.0)
Hemoglobin: 7.4 g/dL — ABNORMAL LOW (ref 13.0–18.0)
MCH: 26.9 pg (ref 26.0–34.0)
MCHC: 32.8 g/dL (ref 32.0–36.0)
MCV: 82 fL (ref 80.0–100.0)
Platelets: 252 10*3/uL (ref 150–440)
RBC: 2.74 MIL/uL — ABNORMAL LOW (ref 4.40–5.90)
RDW: 17.5 % — ABNORMAL HIGH (ref 11.5–14.5)
WBC: 9 10*3/uL (ref 3.8–10.6)

## 2017-12-03 LAB — GLUCOSE, CAPILLARY
Glucose-Capillary: 93 mg/dL (ref 70–99)
Glucose-Capillary: 99 mg/dL (ref 70–99)

## 2017-12-03 MED ORDER — CIPROFLOXACIN HCL 500 MG PO TABS: 500.0000 mg | ORAL_TABLET | Freq: Every day | ORAL | 0 refills | Status: AC

## 2017-12-03 MED ORDER — CIPROFLOXACIN HCL 500 MG PO TABS
500.0000 mg | ORAL_TABLET | Freq: Every day | ORAL | Status: DC
  Administered 2017-12-03: 500 mg via ORAL
  Filled 2017-12-03: qty 1

## 2017-12-03 MED ORDER — NEPRO/CARBSTEADY PO LIQD: 237.0000 mL | Freq: Two times a day (BID) | ORAL | 1 refills | Status: AC

## 2017-12-03 NOTE — Care Management Important Message (Signed)
Important Message  Patient Details  Name: Manuel Walker MRN: [id number] Date of Birth: [birth year]   Medicare Important Message Given:  Yes    Manuel Walker 12/03/2017, 9:40 AM

## 2017-12-03 NOTE — Progress Notes (Signed)
[facility], [place] 12/03/17  Subjective:   Patient states he is feeling better today.   Appetite is good.  No nausea or vomiting reported;   S Creatinine lower at 7.67 today Urine clear this morning  Objective:  Vital signs in last 24 hours:  Temp:  [97.9 F (36.6 C)-99.2 F (37.3 C)] 99.2 F (37.3 C) (07/04 0735) Pulse Rate:  [69-87] 77 (07/04 0735) Resp:  [18] 18 (07/04 0735) BP: (120-165)/(76-85) 134/79 (07/04 0735) SpO2:  [99 %-100 %] 100 % (07/04 0735) Weight:  [89.3 kg (196 lb 12.8 oz)] 89.3 kg (196 lb 12.8 oz) (07/04 0327)  Weight change: -0.408 kg (-14.4 oz) Filed Weights   12/01/17 1900 12/02/17 0529 12/03/17 0327  Weight: 89.7 kg (197 lb 11.2 oz) 89.1 kg (196 lb 8 oz) 89.3 kg (196 lb 12.8 oz)    Intake/Output:    Intake/Output Summary (Last 24 hours) at 12/03/2017 1215 Last data filed at 12/03/2017 0900 Gross per 24 hour  Intake 1200 ml  Output 1150 ml  Net 50 ml     Physical Exam: General:  No acute distress, sitting in bed,    HEENT  anicteric,    Neck  supple,    Pulm/lungs  lungs are clear to auscultation  CVS/Heart  regular rate and rhythm with ectopic beats  Abdomen:   Soft, large midline hernia  Extremities:  No peripheral edema  Neurologic:  Alert, able to answer questions and follow commands  Skin:  No acute rashes          Basic Metabolic Panel:  Recent Labs  Lab 11/28/17 1707 11/29/17 0502 11/30/17 0519 12/01/17 0602 12/02/17 0421 12/02/17 0703 12/03/17 0921  NA 133* 136 137 140 142  --  141  K 4.0 3.7 3.8 3.4* 3.5  --  4.0  CL 96* 101 103 106 111  --  110  CO2 17* 19* 19* 20* 20*  --  20*  GLUCOSE 153* 105* 99 97 94  --  143*  BUN 154* 158* 140* 71* 119*  --  112*  CREATININE 10.44* 10.33* 9.80* 8.97* 8.37*  --  7.67*  CALCIUM 7.3* 6.7* 6.7* 6.8* 6.8*  --  6.9*  MG 2.4  --   --   --   --  2.1  --   PHOS  --  9.9*  --   --   --  6.2*  --      CBC: Recent Labs  Lab 11/28/17 1707  12/02/17 0421 12/03/17 0921  WBC 12.6* 7.6 9.0  NEUTROABS 10.5*  --   --   HGB 7.6* 6.2* 7.4*  HCT 23.2* 18.8* 22.4*  MCV 79.4* 80.7 82.0  PLT 357 277 252     No results found for: HEPBSAG, HEPBSAB, HEPBIGM    Microbiology:  No results found for this or any previous visit (from the past 240 hour(s)).  Coagulation Studies: No results for input(s): LABPROT, INR in the last 72 hours.  Urinalysis: Recent Labs    12/02/17 2108  COLORURINE YELLOW*  LABSPEC 1.006  PHURINE 7.0  GLUCOSEU NEGATIVE  HGBUR LARGE*  BILIRUBINUR NEGATIVE  KETONESUR NEGATIVE  PROTEINUR 100*  NITRITE NEGATIVE  LEUKOCYTESUR LARGE*      Imaging: No results found.   Medications:   . sodium chloride 75 mL/hr at 12/03/17 0551   . calcium acetate  667 mg Oral TID WC  . cholecalciferol  1,000 Units Oral Daily  . ciprofloxacin  500 mg Oral Daily  .  feeding supplement (NEPRO CARB STEADY)  237 mL Oral BID BM  . fluticasone  1 spray Each Nare Daily  . heparin  5,000 Units Subcutaneous Q8H  . insulin aspart  0-5 Units Subcutaneous QHS  . insulin aspart  0-9 Units Subcutaneous TID WC  . multivitamin with minerals  1 tablet Oral Daily  . sodium bicarbonate  650 mg Oral QID   acetaminophen **OR** acetaminophen, ondansetron **OR** ondansetron (ZOFRAN) IV, senna-docusate  Assessment/ Plan:  72 y.o. male with chronic kidney disease stage V, obstructive uropathy status post left urostomy placed in December 2018, hypertension, diabetes type 2, history of Lyme disease, [facility] for syncope  1.  ARF with Chronic kidney disease stage V with obstructive uropathy, left urostomy.  [facility] [place].  Review of care everywhere records show that patient was seen by his primary care physician in February 2019.  At that time  patient's creatinine was 6.1, BUN 74.  He was recommended to follow-up with his urologist as well as nephrology.  Patient did not go for  follow-up.  At present, his BUN and Cr have improved with iv hydration.  He refused nephrostomy placement as well as initiating dialysis this admission Renal ultrasound negative for left hydronephrosis which implies that there is no acute obstruction and urine may be able to pass into the bladder.  Electrolytes and volume status are acceptable.   There is no acute indication for dialysis at present however, he will need it in the near future.    2.  Obstructive uropathy, stone and left ureteral stone Renal Ultrasound negative for hydronephrosis S Creatinine is improving  3. Anemia, likely of chronic kidney disease with acute worsening  blood transfusion given Offered to give EPO injection but patient wants to defer for now  4. Hypokalemia - replace KCl as needed     LOS: 5 Elissia Spiewak 7/4/201912:15 PM  [facility] [place], [place] [phone]  Note: This note was prepared with Dragon dictation. Any transcription errors are unintentional

## 2017-12-03 NOTE — Discharge Summary (Signed)
[facility] [place] at [facility]   PATIENT NAME: Manuel Walker    MR#:  [id number]  DATE OF BIRTH:  [birth year]  DATE OF ADMISSION:  11/28/2017 ADMITTING PHYSICIAN: Ihor Austin, MD  DATE OF DISCHARGE: 12/03/2017  PRIMARY CARE PHYSICIAN: System, Pcp Not In    ADMISSION DIAGNOSIS:  Syncope and collapse [R55] Syncope [R55]  DISCHARGE DIAGNOSIS:  Principal Problem:   Mild dementia Active Problems:   Syncope   SECONDARY DIAGNOSIS:   Past Medical History:  Diagnosis Date  . Diabetes mellitus without complication (HCC)   . Renal disorder     HOSPITAL COURSE:  72 year old male with chronic kidney disease stage V, obstructive uropathy status post left urostomy/stent, diabetes and essential hypertension who presented to the emergency room with a syncopal episode.  1.  Acute metabolic encephalopathy in the setting of uremia.  Encephalopathy has improved to baseline.  Patient was eval by psychiatry.  Psychiatry is recommending that patient not drive home.  He has underlying cognitive impairment as well as per psychiatrist.  I have spoken with patient's wife who will come and pick the patient up.  2.  Syncope: This is due to orthostatic hypotension from elevated creatinine and dehydration.  Echocardiogram shows normal ejection fraction without major valvular abnormalities.  Carotid ultrasound showed no hemodynamically significant stenosis.  Telemetry showed no abnormal heart rhythm.  3.  Chronic kidney disease stage V: Acute on chronic: Patient has a history of obstructive uropathy status post left urostomy tube.   Patient has refused dialysis in the past and again is refusing at this time.  Patient was followed by nephrology while in the hospital.  Patient and wife report that he will follow up with nephrology back in [facility].  4.  Elevated troponin: This is due to poor renal clearance.  Patient was ruled out for ACS.  5.  Essential hypertension: Blood  pressure is improved after IV fluids and patient may resume beta-blocker. 6.  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  Patient refused Epogen.  6.  Obstructive uropathy and left ureteral stone: Patient has refused nephrostomy tube placement.  Patient was evaluated by urology while in the hospital.    DISCHARGE CONDITIONS AND DIET:   Stable Renal diet I spoke with patient's wife who will pick patient up.  CONSULTS OBTAINED:  Treatment Team:  Lamont Dowdy, MD Crista Elliot, MD Vanna Scotland, MD Clapacs, Jackquline Denmark, MD  DRUG ALLERGIES:   Allergies  Allergen Reactions  . Penicillins Swelling    Has patient had a PCN reaction causing immediate rash, facial/tongue/throat swelling, SOB or lightheadedness with hypotension: Unknown Has patient had a PCN reaction causing severe rash involving mucus membranes or skin necrosis: Unknown Has patient had a PCN reaction that required hospitalization: Unknown Has patient had a PCN reaction occurring within the last 10 years: Unknown If all of the above answers are "NO", then may proceed with Cephalosporin use.    DISCHARGE MEDICATIONS:   Allergies as of 12/03/2017      Reactions   Penicillins Swelling   Has patient had a PCN reaction causing immediate rash, facial/tongue/throat swelling, SOB or lightheadedness with hypotension: Unknown Has patient had a PCN reaction causing severe rash involving mucus membranes or skin necrosis: Unknown Has patient had a PCN reaction that required hospitalization: Unknown Has patient had a PCN reaction occurring within the last 10 years: Unknown If all of the above answers are "NO", then may proceed  with Cephalosporin use.      Medication List    STOP taking these medications   BYSTOLIC 5 MG tablet Generic drug:  nebivolol     TAKE these medications   calcium acetate 667 MG capsule Commonly known as:  PHOSLO Take 1 capsule by mouth 3 (three) times daily.    cholecalciferol 1000 units tablet Commonly known as:  VITAMIN D Take 1 tablet by mouth daily.   ciprofloxacin 500 MG tablet Commonly known as:  CIPRO Take 1 tablet (500 mg total) by mouth daily for 6 days.   feeding supplement (NEPRO CARB STEADY) Liqd Take 237 mLs by mouth 2 (two) times daily between meals.   fluticasone 50 MCG/ACT nasal spray Commonly known as:  FLONASE Place 1 spray into the nose daily.   metoprolol succinate 50 MG 24 hr tablet Commonly known as:  TOPROL-XL Take 1 tablet by mouth daily.   sodium bicarbonate 650 MG tablet Take 1 tablet by mouth 4 (four) times daily.   VITAMIN B COMPLEX PO Take 1 tablet by mouth daily.         Today   CHIEF COMPLAINT:   No acute events overnight.  Patient still refusing dialysis   VITAL SIGNS:  Blood pressure 134/79, pulse 77, temperature 99.2 F (37.3 C), temperature source Oral, resp. rate 18, height 6' (1.829 m), weight 89.3 kg (196 lb 12.8 oz), SpO2 100 %.   REVIEW OF SYSTEMS:  Review of Systems  Constitutional: Negative.  Negative for chills, fever and malaise/fatigue.  HENT: Negative.  Negative for ear discharge, ear pain, hearing loss, nosebleeds and sore throat.   Eyes: Negative.  Negative for blurred vision and pain.  Respiratory: Negative.  Negative for cough, hemoptysis, shortness of breath and wheezing.   Cardiovascular: Negative.  Negative for chest pain, palpitations and leg swelling.  Gastrointestinal: Negative.  Negative for abdominal pain, blood in stool, diarrhea, nausea and vomiting.  Genitourinary: Negative.  Negative for dysuria.  Musculoskeletal: Negative.  Negative for back pain.  Skin: Negative.   Neurological: Negative for dizziness, tremors, speech change, focal weakness, seizures and headaches.  Endo/Heme/Allergies: Negative.  Does not bruise/bleed easily.  Psychiatric/Behavioral: Negative.  Negative for depression, hallucinations and suicidal ideas.     PHYSICAL EXAMINATION:   GENERAL:  72 y.o.-year-old patient lying in the bed with no acute distress.  NECK:  Supple, no jugular venous distention. No thyroid enlargement, no tenderness.  LUNGS: Normal breath sounds bilaterally, no wheezing, rales,rhonchi  No use of accessory muscles of respiration.  CARDIOVASCULAR: S1, S2 normal. No murmurs, rubs, or gallops.  ABDOMEN: Soft, non-tender, non-distended. Bowel sounds present. No organomegaly or mass.  EXTREMITIES: No pedal edema, cyanosis, or clubbing.  PSYCHIATRIC: The patient is alert and oriented x 3.  SKIN: No obvious rash, lesion, or ulcer.   DATA REVIEW:   CBC Recent Labs  Lab 12/03/17 0921  WBC 9.0  HGB 7.4*  HCT 22.4*  PLT 252    Chemistries  Recent Labs  Lab 11/28/17 1707  12/02/17 0421 12/02/17 0703  NA 133*   < > 142  --   K 4.0   < > 3.5  --   CL 96*   < > 111  --   CO2 17*   < > 20*  --   GLUCOSE 153*   < > 94  --   BUN 154*   < > 119*  --   CREATININE 10.44*   < > 8.37*  --   CALCIUM 7.3*   < >  6.8*  --   MG 2.4  --   --  2.1  AST 16  --   --   --   ALT 20  --   --   --   ALKPHOS 62  --   --   --   BILITOT 0.4  --   --   --    < > = values in this interval not displayed.    Cardiac Enzymes Recent Labs  Lab 11/28/17 1707 11/28/17 2210 11/29/17 0502  TROPONINI 0.05* 0.05* 0.05*    Microbiology Results  @[id number] @  RADIOLOGY:  [place] Renal  Result Date: 12/01/2017 CLINICAL DATA:  Acute renal failure EXAM: RENAL / URINARY TRACT ULTRASOUND COMPLETE COMPARISON:  CT 11/29/2017 FINDINGS: Right Kidney: Length: 9.4 cm. Cortical thinning and increased echotexture. Small scattered cysts, the largest 2.5 cm in the lower pole. No hydronephrosis. Left Kidney: Length: 10.6 cm. Mildly increased echotexture and cortical thinning. 9 mm echogenic shadowing focus in the region of the renal pelvis may reflect renal pelvic stone or indwelling ureteral stent. Mild caliectasis. Bladder: Appears normal for degree of bladder distention. Ureteral  stent noted. IMPRESSION: Cortical thinning and increased echotexture bilaterally, right greater than left compatible chronic medical renal disease. Left ureteral stent in place. Shadowing echogenic area within the left renal pelvis may be the stent or renal pelvic stone. Mild caliectasis on the left. Electronically Signed   By: Charlett NoseKevin  Dover M.D.   On: 12/01/2017 10:27      Allergies as of 12/03/2017      Reactions   Penicillins Swelling   Has patient had a PCN reaction causing immediate rash, facial/tongue/throat swelling, SOB or lightheadedness with hypotension: Unknown Has patient had a PCN reaction causing severe rash involving mucus membranes or skin necrosis: Unknown Has patient had a PCN reaction that required hospitalization: Unknown Has patient had a PCN reaction occurring within the last 10 years: Unknown If all of the above answers are "NO", then may proceed with Cephalosporin use.      Medication List    STOP taking these medications   BYSTOLIC 5 MG tablet Generic drug:  nebivolol     TAKE these medications   calcium acetate 667 MG capsule Commonly known as:  PHOSLO Take 1 capsule by mouth 3 (three) times daily.   cholecalciferol 1000 units tablet Commonly known as:  VITAMIN D Take 1 tablet by mouth daily.   ciprofloxacin 500 MG tablet Commonly known as:  CIPRO Take 1 tablet (500 mg total) by mouth daily for 6 days.   feeding supplement (NEPRO CARB STEADY) Liqd Take 237 mLs by mouth 2 (two) times daily between meals.   fluticasone 50 MCG/ACT nasal spray Commonly known as:  FLONASE Place 1 spray into the nose daily.   metoprolol succinate 50 MG 24 hr tablet Commonly known as:  TOPROL-XL Take 1 tablet by mouth daily.   sodium bicarbonate 650 MG tablet Take 1 tablet by mouth 4 (four) times daily.   VITAMIN B COMPLEX PO Take 1 tablet by mouth daily.        Management plans discussed with the patient and wife and they are in agreement. Stable for discharge  home  Patient should follow up with pcp  CODE STATUS:     Code Status Orders  (From admission, onward)        Start     Ordered   11/28/17 2011  Full code  Continuous     11/28/17 2010    Code  Status History    This patient has a current code status but no historical code status.      TOTAL TIME TAKING CARE OF THIS PATIENT: 38 minutes.    Note: This dictation was prepared with Dragon dictation along with smaller phrase technology. Any transcriptional errors that result from this process are unintentional.  Mychaela Lennartz M.D on 12/03/2017 at 9:54 AM  Between 7am to 6pm - Pager - [phone] After 6pm go to [email or web address] - password [email or web address]  [facility]  Office  [phone]  CC: Primary care physician; System, Pcp Not In

## 2017-12-03 NOTE — Progress Notes (Signed)
Wife arrived and stated she is going to drive patient home. She was brought here by a friend. Discharge instructions given in the presence of patient and wife. Instructions and scripts given to wife. All questions answered.

## 2017-12-04 LAB — TYPE AND SCREEN
ABO/RH(D): B NEG
Antibody Screen: NEGATIVE
UNIT DIVISION: 0
Unit division: 0

## 2017-12-04 LAB — BPAM RBC
BLOOD PRODUCT EXPIRATION DATE: 201907222359
Blood Product Expiration Date: 201907262359
ISSUE DATE / TIME: 201907031001
UNIT TYPE AND RH: 1700
Unit Type and Rh: 1700

## 2017-12-04 LAB — PREPARE RBC (CROSSMATCH)

## 2017-12-09 LAB — PARATHYROID HORMONE, INTACT (NO CA): PTH: UNDETERMINED pg/mL

## 2018-10-06 IMAGING — US US RENAL
1 series · 14 of 25 positions shown · non-contrast
Comparison: CT 11/29/2017

CLINICAL DATA: Acute renal failure

EXAM:
RENAL / URINARY TRACT ULTRASOUND COMPLETE

[Series 1: us renal · 74 acquisitions, 14 frames shown]
[im 1/74]
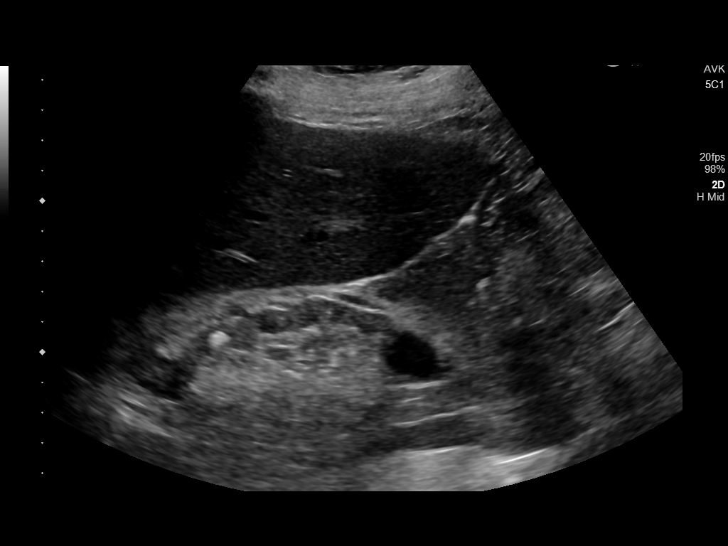
[im 7/74]
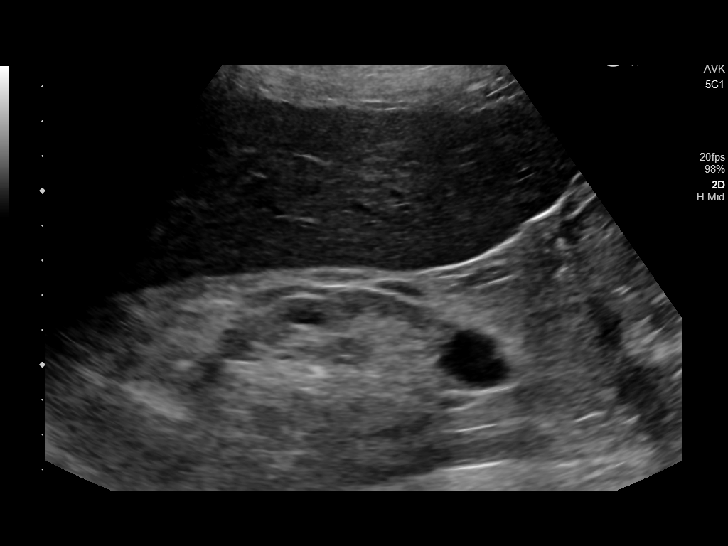
[im 13/74]
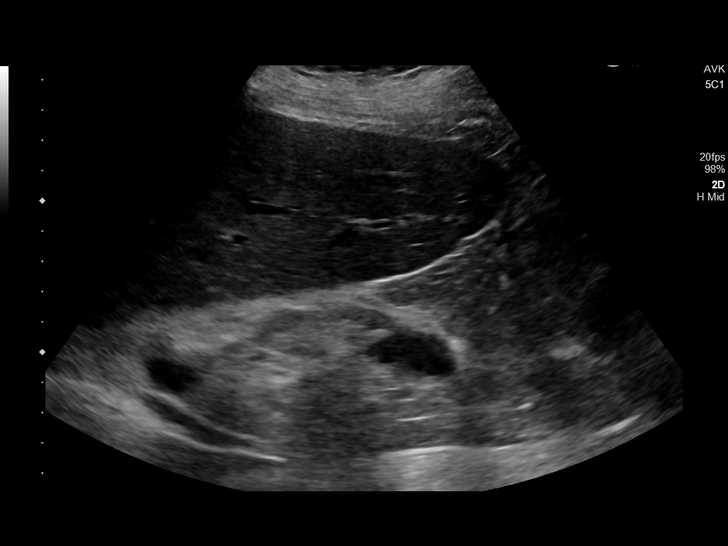
[im 19/74]
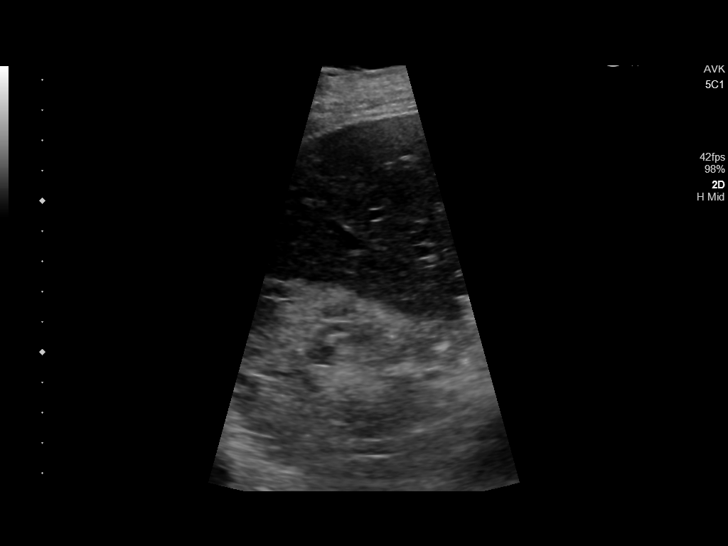
[im 25/74]
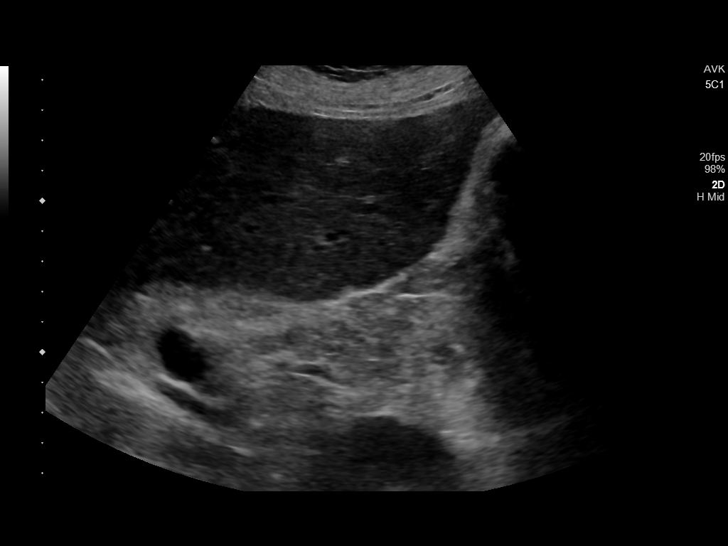
[im 28/74]
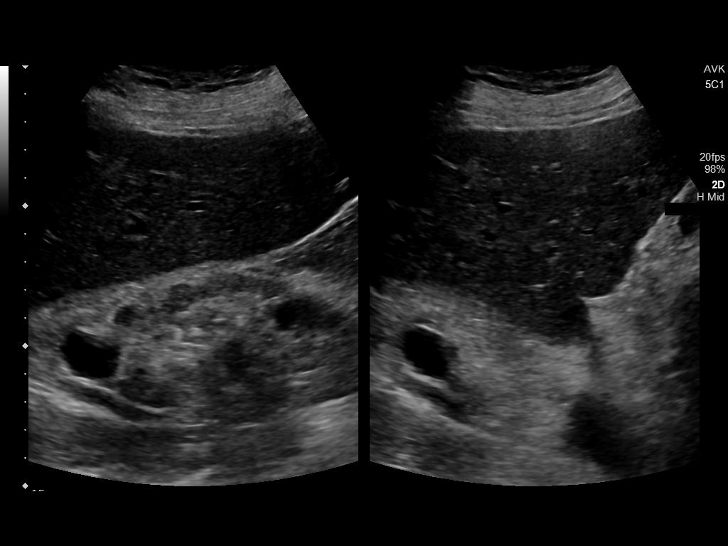
[im 34/74]
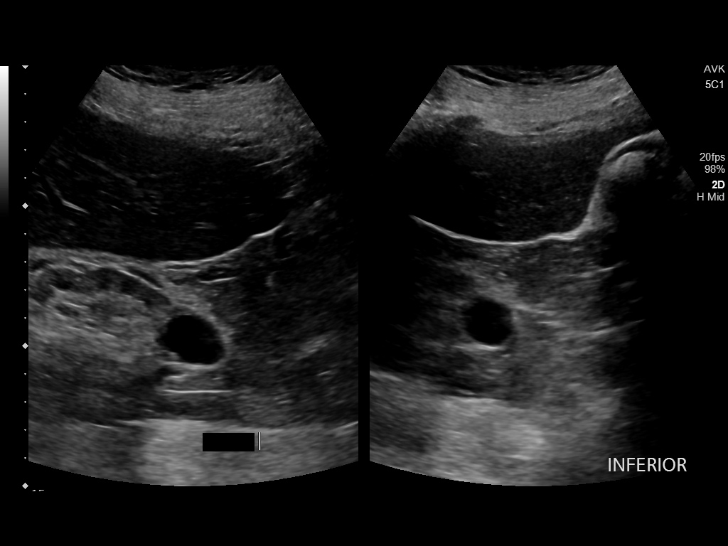
[im 40/74]
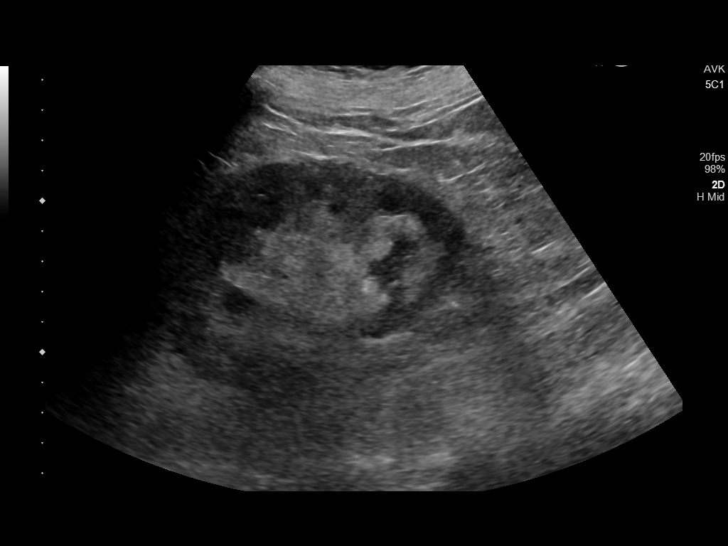
[im 46/74]
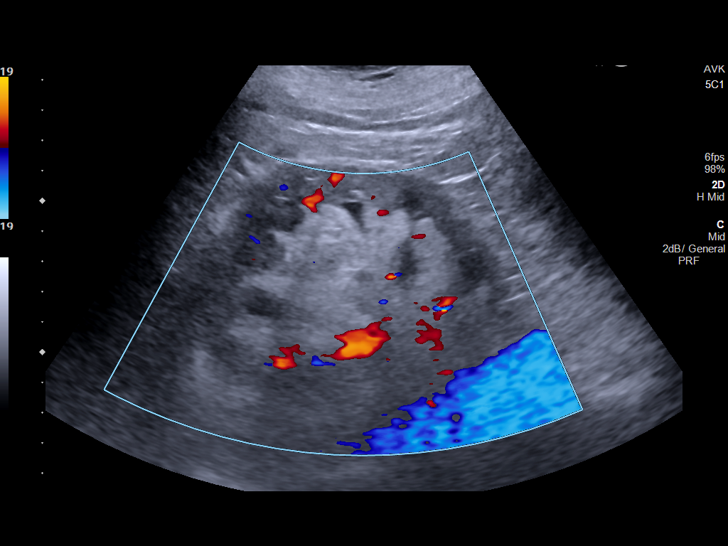
[im 49/74]
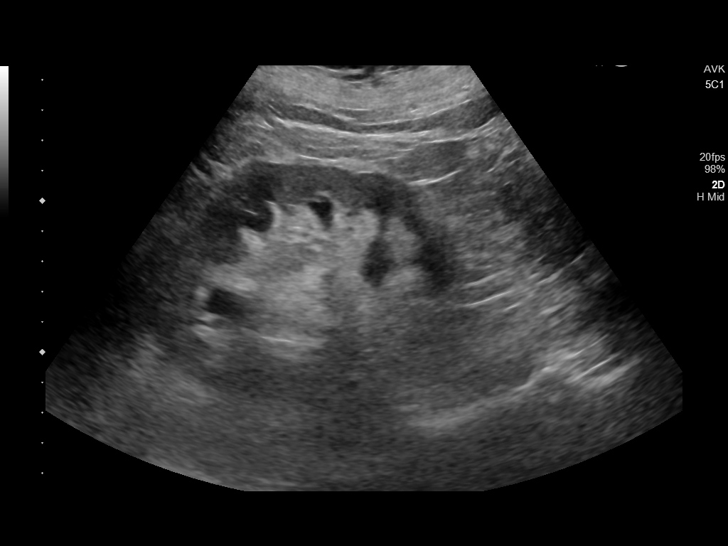
[im 55/74]
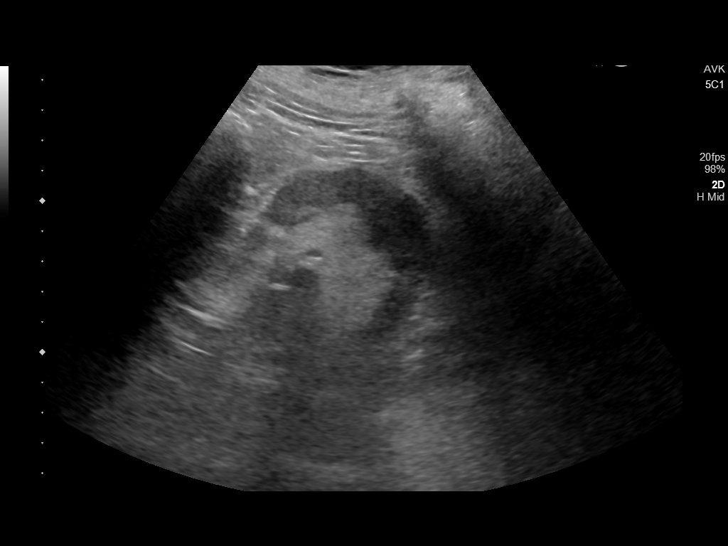
[im 61/74]
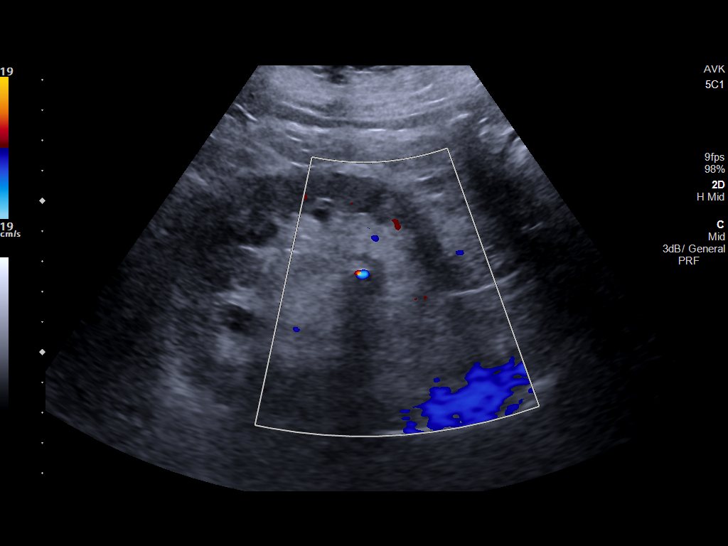
[im 67/74]
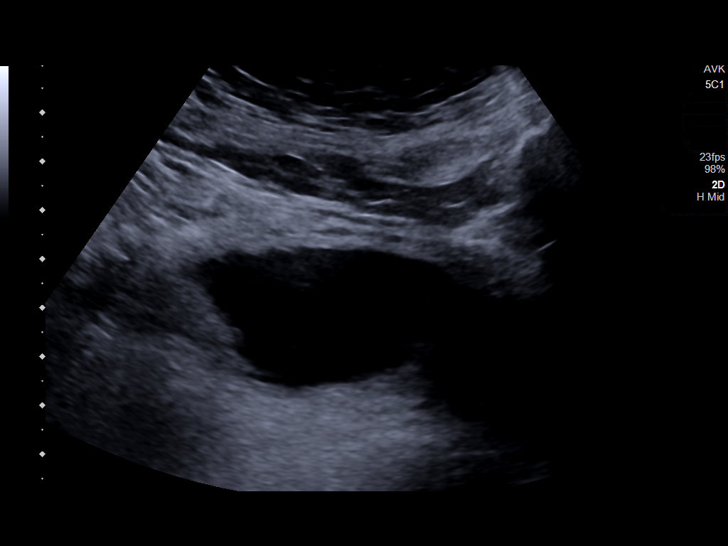
[im 74/74]
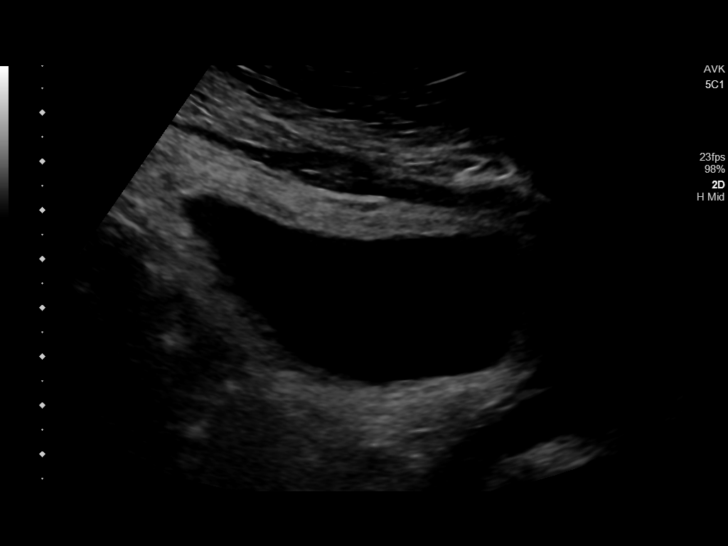

[14 of 25 positions shown; findings below may reference images not displayed]

FINDINGS: Right Kidney:

Length: 9.4 cm. Cortical thinning and increased echotexture. Small
scattered cysts, the largest 2.5 cm in the lower pole. No
hydronephrosis.

Left Kidney:

Length: 10.6 cm. Mildly increased echotexture and cortical thinning.
9 mm echogenic shadowing focus in the region of the renal pelvis may
reflect renal pelvic stone or indwelling ureteral stent. Mild
caliectasis.

Bladder:

Appears normal for degree of bladder distention. Ureteral stent
noted.
IMPRESSION: Cortical thinning and increased echotexture bilaterally, right
greater than left compatible chronic medical renal disease.

Left ureteral stent in place. Shadowing echogenic area within the
left renal pelvis may be the stent or renal pelvic stone. Mild
caliectasis on the left.

## 2019-07-04 DEATH — deceased
